# Patient Record
Sex: Female | Born: 1980 | Hispanic: Yes | Marital: Married | Smoking: Never smoker
Health system: Southern US, Community
[De-identification: ages and names within clinical notes are randomized; demographics above are authoritative.]

## PROBLEM LIST (undated history)

## (undated) DIAGNOSIS — J45909 Unspecified asthma, uncomplicated: Secondary | ICD-10-CM

## (undated) DIAGNOSIS — R569 Unspecified convulsions: Secondary | ICD-10-CM

## (undated) HISTORY — PX: SPINAL FUSION: SHX223

## (undated) HISTORY — PX: BACK SURGERY: SHX140

---

## 2019-02-22 NOTE — Progress Notes (Deleted)
[facility]    Provider:  Dr Lucia GaskinsAhern Requesting Provider: Esau GrewLee, Jinoo T, MD Primary Care Provider:   Esau GrewLee, Jinoo T, MD  CC:  ***  HPI:  Lisa Holder is a 38 y.o. female here as requested by Esau GrewLee, Jinoo T, MD for seizure.  Past medical history depression, seizure disorder, lumbosacral pain, chronic, bipolar 2 disorder.  Reviewed notes, labs and imaging from outside physicians, which showed ***  I reviewed notes from Dr. Nedra HaiLee.  She was last seen December 14, 2018.  Minor complaints.  Not using any method of contraception.  Patient does not exercise.  Patient sleeps 5 hours per night.  Libido is decreased.  She performs monthly self breast exam.  She is a history of seizure disorder Trileptal, Keppra on her med list.  Labs were taken including CBC, CMP, lipids, urinalysis, HIV 1 and 2, hemoglobin A1c however I was not provided with those results or any other details of her seizure disorder.  There are no further records in epic or in care everywhere.  I will request labs from her requesting physician Dr. Nedra HaiLee.  I reviewed physical examination which was normal.  Review of Systems: Patient complains of symptoms per HPI as well as the following symptoms ***. Pertinent negatives and positives per HPI. All others negative.   Social History   Socioeconomic History  . Marital status: Not on file    Spouse name: Not on file  . Number of children: Not on file  . Years of education: Not on file  . Highest education level: Not on file  Occupational History  . Not on file  Social Needs  . Financial resource strain: Not on file  . Food insecurity    Worry: Not on file    Inability: Not on file  . Transportation needs    Medical: Not on file    Non-medical: Not on file  Tobacco Use  . Smoking status: Not on file  Substance and Sexual Activity  . Alcohol use: Not on file  . Drug use: Not on file  . Sexual activity: Not on file  Lifestyle  . Physical activity    Days per week:  Not on file    Minutes per session: Not on file  . Stress: Not on file  Relationships  . Social Musicianconnections    Talks on phone: Not on file    Gets together: Not on file    Attends religious service: Not on file    Active member of club or organization: Not on file    Attends meetings of clubs or organizations: Not on file    Relationship status: Not on file  . Intimate partner violence    Fear of current or ex partner: Not on file    Emotionally abused: Not on file    Physically abused: Not on file    Forced sexual activity: Not on file  Other Topics Concern  . Not on file  Social History Narrative  . Not on file    No family history on file.  No past medical history on file.  There are no active problems to display for this patient.   *** The histories are not reviewed yet. Please review them in the "History" navigator section and refresh this SmartLink.  No current outpatient medications on file.   No current facility-administered medications for this visit.     Allergies as of 02/23/2019  . (Not on File)    Vitals: There were no  vitals taken for this visit. Last Weight:  Wt Readings from Last 1 Encounters:  No data found for Wt   Last Height:   Ht Readings from Last 1 Encounters:  No data found for Ht     Physical exam: Exam: Gen: NAD, conversant, well nourised, obese, well groomed                     CV: RRR, no MRG. No Carotid Bruits. No peripheral edema, warm, nontender Eyes: Conjunctivae clear without exudates or hemorrhage  Neuro: Detailed Neurologic Exam  Speech:    Speech is normal; fluent and spontaneous with normal comprehension.  Cognition:    The patient is oriented to person, place, and time;     recent and remote memory intact;     language fluent;     normal attention, concentration,     fund of knowledge Cranial Nerves:    The pupils are equal, round, and reactive to light. The fundi are normal and spontaneous venous pulsations  are present. Visual fields are full to finger confrontation. Extraocular movements are intact. Trigeminal sensation is intact and the muscles of mastication are normal. The face is symmetric. The palate elevates in the midline. Hearing intact. Voice is normal. Shoulder shrug is normal. The tongue has normal motion without fasciculations.   Coordination:    Normal finger to nose and heel to shin. Normal rapid alternating movements.   Gait:    Heel-toe and tandem gait are normal.   Motor Observation:    No asymmetry, no atrophy, and no involuntary movements noted. Tone:    Normal muscle tone.    Posture:    Posture is normal. normal erect    Strength:    Strength is V/V in the upper and lower limbs.      Sensation: intact to LT     Reflex Exam:  DTR's:    Deep tendon reflexes in the upper and lower extremities are normal bilaterally.   Toes:    The toes are downgoing bilaterally.   Clonus:    Clonus is absent.    Assessment/Plan:    No orders of the defined types were placed in this encounter.  No orders of the defined types were placed in this encounter.   Cc: Sharon Seller, MD,  No primary care provider on file.  Sarina Ill, MD  [facility] [address] [place], [place] [address]  Phone [phone] Fax [phone]

## 2019-02-23 ENCOUNTER — Ambulatory Visit: Payer: Medicaid Other | Admitting: Neurology

## 2020-08-08 ENCOUNTER — Emergency Department (HOSPITAL_COMMUNITY)
Admission: EM | Admit: 2020-08-08 | Discharge: 2020-08-08 | Disposition: A | Discharge status: Home / Self Care | Payer: Medicaid Other | Attending: Emergency Medicine | Admitting: Emergency Medicine

## 2020-08-08 ENCOUNTER — Other Ambulatory Visit: Payer: Self-pay

## 2020-08-08 ENCOUNTER — Emergency Department (HOSPITAL_COMMUNITY): Payer: Medicaid Other

## 2020-08-08 ENCOUNTER — Encounter (HOSPITAL_COMMUNITY): Payer: Self-pay

## 2020-08-08 DIAGNOSIS — R1031 Right lower quadrant pain: Secondary | ICD-10-CM | POA: Insufficient documentation

## 2020-08-08 DIAGNOSIS — R319 Hematuria, unspecified: Secondary | ICD-10-CM | POA: Insufficient documentation

## 2020-08-08 DIAGNOSIS — R200 Anesthesia of skin: Secondary | ICD-10-CM | POA: Diagnosis not present

## 2020-08-08 DIAGNOSIS — W19XXXA Unspecified fall, initial encounter: Secondary | ICD-10-CM

## 2020-08-08 DIAGNOSIS — J45909 Unspecified asthma, uncomplicated: Secondary | ICD-10-CM | POA: Diagnosis not present

## 2020-08-08 DIAGNOSIS — Z91010 Allergy to peanuts: Secondary | ICD-10-CM | POA: Diagnosis not present

## 2020-08-08 DIAGNOSIS — S32009A Unspecified fracture of unspecified lumbar vertebra, initial encounter for closed fracture: Secondary | ICD-10-CM | POA: Diagnosis not present

## 2020-08-08 DIAGNOSIS — W001XXA Fall from stairs and steps due to ice and snow, initial encounter: Secondary | ICD-10-CM | POA: Diagnosis not present

## 2020-08-08 DIAGNOSIS — M545 Low back pain, unspecified: Secondary | ICD-10-CM | POA: Diagnosis present

## 2020-08-08 DIAGNOSIS — M549 Dorsalgia, unspecified: Secondary | ICD-10-CM

## 2020-08-08 HISTORY — DX: Unspecified convulsions: R56.9

## 2020-08-08 HISTORY — DX: Unspecified asthma, uncomplicated: J45.909

## 2020-08-08 LAB — URINALYSIS, ROUTINE W REFLEX MICROSCOPIC
Bilirubin Urine: NEGATIVE
Glucose, UA: NEGATIVE mg/dL
Hgb urine dipstick: NEGATIVE
Ketones, ur: NEGATIVE mg/dL
Nitrite: NEGATIVE
Protein, ur: NEGATIVE mg/dL
Specific Gravity, Urine: 1.015 (ref 1.005–1.030)
pH: 5 (ref 5.0–8.0)

## 2020-08-08 LAB — CBC WITH DIFFERENTIAL/PLATELET
Abs Immature Granulocytes: 0.04 10*3/uL (ref 0.00–0.07)
Basophils Absolute: 0.1 10*3/uL (ref 0.0–0.1)
Basophils Relative: 1 %
Eosinophils Absolute: 0.2 10*3/uL (ref 0.0–0.5)
Eosinophils Relative: 2 %
HCT: 41.4 % (ref 36.0–46.0)
Hemoglobin: 14.2 g/dL (ref 12.0–15.0)
Immature Granulocytes: 0 %
Lymphocytes Relative: 39 %
Lymphs Abs: 3.5 10*3/uL (ref 0.7–4.0)
MCH: 32.2 pg (ref 26.0–34.0)
MCHC: 34.3 g/dL (ref 30.0–36.0)
MCV: 93.9 fL (ref 80.0–100.0)
Monocytes Absolute: 0.7 10*3/uL (ref 0.1–1.0)
Monocytes Relative: 8 %
Neutro Abs: 4.6 10*3/uL (ref 1.7–7.7)
Neutrophils Relative %: 50 %
Platelets: 263 10*3/uL (ref 150–400)
RBC: 4.41 MIL/uL (ref 3.87–5.11)
RDW: 12.7 % (ref 11.5–15.5)
WBC: 9.1 10*3/uL (ref 4.0–10.5)
nRBC: 0 % (ref 0.0–0.2)

## 2020-08-08 LAB — COMPREHENSIVE METABOLIC PANEL
ALT: 29 U/L (ref 0–44)
AST: 33 U/L (ref 15–41)
Albumin: 4.2 g/dL (ref 3.5–5.0)
Alkaline Phosphatase: 38 U/L (ref 38–126)
Anion gap: 10 (ref 5–15)
BUN: 16 mg/dL (ref 6–20)
CO2: 23 mmol/L (ref 22–32)
Calcium: 9.2 mg/dL (ref 8.9–10.3)
Chloride: 102 mmol/L (ref 98–111)
Creatinine, Ser: 0.87 mg/dL (ref 0.44–1.00)
GFR, Estimated: >60 mL/min (ref >60)
Glucose, Bld: 107 mg/dL — ABNORMAL HIGH (ref 70–99)
Potassium: 5 mmol/L (ref 3.5–5.1)
Sodium: 135 mmol/L (ref 135–145)
Total Bilirubin: 1.1 mg/dL (ref 0.3–1.2)
Total Protein: 7.8 g/dL (ref 6.5–8.1)

## 2020-08-08 LAB — I-STAT BETA HCG BLOOD, ED (MC, WL, AP ONLY): I-stat hCG, quantitative: <5 m[IU]/mL (ref <5)

## 2020-08-08 MED ORDER — KETOROLAC TROMETHAMINE 15 MG/ML IJ SOLN
15.0000 mg | Freq: Once | INTRAMUSCULAR | Status: AC
  Administered 2020-08-08: 15 mg via INTRAVENOUS
  Filled 2020-08-08: qty 1

## 2020-08-08 MED ORDER — NAPROXEN 500 MG PO TABS: 500.0000 mg | ORAL_TABLET | Freq: Two times a day (BID) | ORAL | 0 refills | Status: DC | PRN

## 2020-08-08 MED ORDER — SODIUM CHLORIDE 0.9 % IV BOLUS
1000.0000 mL | Freq: Once | INTRAVENOUS | Status: AC
  Administered 2020-08-08: 1000 mL via INTRAVENOUS

## 2020-08-08 MED ORDER — MORPHINE SULFATE (PF) 4 MG/ML IV SOLN
4.0000 mg | Freq: Once | INTRAVENOUS | Status: AC
Start: 2020-08-08 — End: 2020-08-08
  Administered 2020-08-08: 4 mg via INTRAVENOUS
  Filled 2020-08-08: qty 1

## 2020-08-08 MED ORDER — IOHEXOL 300 MG/ML  SOLN
100.0000 mL | Freq: Once | INTRAMUSCULAR | Status: AC | PRN
  Administered 2020-08-08: 100 mL via INTRAVENOUS

## 2020-08-08 MED ORDER — LIDOCAINE 5 % EX PTCH
1.0000 | MEDICATED_PATCH | CUTANEOUS | Status: DC
  Administered 2020-08-08: 1 via TRANSDERMAL
  Filled 2020-08-08: qty 1

## 2020-08-08 MED ORDER — ONDANSETRON HCL 4 MG/2ML IJ SOLN
4.0000 mg | Freq: Once | INTRAMUSCULAR | Status: AC
  Administered 2020-08-08: 4 mg via INTRAVENOUS
  Filled 2020-08-08: qty 2

## 2020-08-08 MED ORDER — OXYCODONE-ACETAMINOPHEN 5-325 MG PO TABS: 1.0000 | ORAL_TABLET | Freq: Four times a day (QID) | ORAL | 0 refills | Status: DC | PRN

## 2020-08-08 MED ORDER — LIDOCAINE 5 % EX PTCH: 1.0000 | MEDICATED_PATCH | Freq: Every day | CUTANEOUS | 0 refills | Status: DC | PRN

## 2020-08-08 NOTE — ED Triage Notes (Addendum)
Patient arrived after a fall on Monday causing her lower back pain, denies any LOC. Last dose of Ibuprofen two hours ago. Reports back surgery about 5 years ago. Ambulatory in triage

## 2020-08-08 NOTE — Discharge Instructions (Addendum)
You were seen in the ER today after a fall.  Your CT scan showed findings of transverse processes of your L2 and L3 vertebra.  Please follow-up with your primary care provider or neurosurgery to further discuss these.  These will likely need to heal on their own.  We are sending home with the following medicines to help with pain:  - Naproxen is a nonsteroidal anti-inflammatory medication that will help with pain and swelling. Be sure to take this medication as prescribed with food, 1 pill every 12 hours,  It should be taken with food, as it can cause stomach upset, and more seriously, stomach bleeding. Do not take other nonsteroidal anti-inflammatory medications with this such as Advil, Motrin, Aleve, Mobic, Goodie Powder, or Motrin.    - Lidoderm patch- apply 1 patch to your area of most significant pain once per day. Remove and disregard within 12 hours of application.    -Percocet-this is a narcotic/controlled substance medication that has potential addicting qualities.  We recommend that you take 1-2 tablets every 6 hours as needed for severe pain.  Do not drive or operate heavy machinery when taking this medicine as it can be sedating. Do not drink alcohol or take other sedating medications when taking this medicine for safety reasons.  Keep this out of reach of small children.  Please be aware this medicine has Tylenol in it (325 mg/tab) do not exceed the maximum dose of Tylenol in a day per over the counter recommendations should you decide to supplement with Tylenol over the counter.   We have prescribed you new medication(s) today. Discuss the medications prescribed today with your pharmacist as they can have adverse effects and interactions with your other medicines including over the counter and prescribed medications. Seek medical evaluation if you start to experience new or abnormal symptoms after taking one of these medicines, seek care immediately if you start to experience difficulty  breathing, feeling of your throat closing, facial swelling, or rash as these could be indications of a more serious allergic reaction  Please try to rest, but be sure to still get out of bed and move some like walking to the restroom/minor activity in house.   Please follow up within the next 1 week. Return to the ER for new or worsening symptoms including but not limited to numbness, weakness, loss of bowel/bladder control, fever, blood in your urine or stool, coughing up blood, trouble breathing, passing out, inability to walk, or any other concerns.

## 2020-08-08 NOTE — ED Provider Notes (Signed)
[facility]-EMERGENCY DEPT Provider Note   CSN: [id number] Arrival date & time: 08/08/20  0013     History Chief Complaint  Patient presents with  . Back Pain    Lisa Holder is a 40 y.o. female with a hx of asthma, seizures, and prior spinal fusion who presents to the ED with complaints of back pain S/p fall 3 days prior. Patient states that she slipped and fell on the ice directly onto her lower back and slid down a few steps. She thinks she bumped her head but did not have LOC. No significant immediate pain but has since developed pain to the lower back especially on the right side as well as to the right abdomen some. She noted some blood in her urine yesterday and has had some pain with urination. She is not due for her menstrual cycle. She tried taking ibuprofen without relief. She does have chronic paresthesias down the RLE which seems a bit more prominent, but no new distribution & not completely numb. Denies  weakness, saddle anesthesia, incontinence to bowel/bladder, fever, chills, IV drug use, dysuria, or hx of cancer.   HPI     Past Medical History:  Diagnosis Date  . Asthma   . Seizures (HCC)     There are no problems to display for this patient.   Past Surgical History:  Procedure Laterality Date  . SPINAL FUSION       OB History   No obstetric history on file.     No family history on file.     Home Medications Prior to Admission medications   Not on File    Allergies    Peanut-containing drug products  Review of Systems   Review of Systems  Constitutional: Negative for chills and fever.  Respiratory: Negative for shortness of breath.   Gastrointestinal: Positive for abdominal pain. Negative for vomiting.  Genitourinary: Positive for hematuria.  Musculoskeletal: Positive for back pain.  Neurological: Positive for numbness (RLE paresthesias). Negative for weakness and headaches.       Negative for incontinence or saddle  anesthesia.   All other systems reviewed and are negative.   Physical Exam Updated Vital Signs BP 123/83 (BP Location: Left Arm)   Pulse 99   Temp 99.8 F (37.7 C) (Oral)   Resp 18   SpO2 100%   Physical Exam Vitals and nursing note reviewed.  Constitutional:      General: She is not in acute distress.    Appearance: She is well-developed and well-nourished.  HENT:     Head: Normocephalic and atraumatic. No raccoon eyes or Battle's sign.     Right Ear: No hemotympanum.     Left Ear: No hemotympanum.     Mouth/Throat:     Mouth: Oropharynx is clear and moist.  Eyes:     General:        Right eye: No discharge.        Left eye: No discharge.     Extraocular Movements: EOM normal.     Conjunctiva/sclera: Conjunctivae normal.     Pupils: Pupils are equal, round, and reactive to light.  Cardiovascular:     Rate and Rhythm: Normal rate and regular rhythm.     Pulses:          Posterior tibial pulses are 2+ on the right side and 2+ on the left side.     Heart sounds: No murmur heard.   Pulmonary:     Effort: No respiratory  distress.     Breath sounds: Normal breath sounds. No wheezing or rales.  Chest:    Abdominal:     General: There is no distension.     Palpations: Abdomen is soft.     Tenderness: There is abdominal tenderness in the right lower quadrant. There is no guarding or rebound.  Musculoskeletal:     Cervical back: Normal range of motion and neck supple. No spinous process tenderness.     Comments: Upper/lower extremities: Patient is actively ranging at all major joints of the upper and lower extremities.  There is no focal bony tenderness to the extremities. Back: Patient has tenderness palpation to the lower portion to the entire lumbar spine region including midline and right-sided paraspinal muscles.  Patient has ecchymosis over the right lumbar paraspinal muscle region. Mild tenderness to the very lower portion of the thoracic midline and right paraspinal  muscles. No mid/upper thoracic midline tenderness.   Skin:    General: Skin is warm and dry.     Findings: No rash.  Neurological:     Comments: Alert.  Clear speech.  CN III through XII grossly intact.  Sensation grossly intact bilateral upper and lower extremities.  5 out of 5 symmetric grip strength.  5 out of 5 strength with plantar dorsiflexion bilaterally as well as knee flexion/extension bilaterally.  Psychiatric:        Mood and Affect: Mood and affect normal.        Behavior: Behavior normal.     ED Results / Procedures / Treatments   Labs (all labs ordered are listed, but only abnormal results are displayed) Labs Reviewed  COMPREHENSIVE METABOLIC PANEL - Abnormal; Notable for the following components:      Result Value   Glucose, Bld 107 (*)    All other components within normal limits  CBC WITH DIFFERENTIAL/PLATELET  URINALYSIS, ROUTINE W REFLEX MICROSCOPIC  I-STAT BETA HCG BLOOD, ED ([facility], [facility], [facility] ONLY)    EKG None  Radiology DG Chest 2 View  Result Date: 08/08/2020 CLINICAL DATA:  Fall, right flank pain, rib pain EXAM: CHEST - 2 VIEW COMPARISON:  None. FINDINGS: The heart size and mediastinal contours are within normal limits. Both lungs are clear. The visualized skeletal structures are unremarkable. No visible displaced rib fracture or pneumothorax. IMPRESSION: Negative. Electronically Signed   By: Charlett Nose M.D.   On: 08/08/2020 02:01   CT Abdomen Pelvis W Contrast  Result Date: 08/08/2020 CLINICAL DATA:  Acute pain due trauma EXAM: CT ABDOMEN AND PELVIS WITH CONTRAST CT LUMBAR SPINE WITHOUT CONTRAST TECHNIQUE: Multidetector CT imaging of the abdomen and pelvis was performed using the standard protocol following bolus administration of intravenous contrast. Multiplanar CT images of the lumbar spine were reconstructed from contemporary CT of the Abdomen, and Pelvis CONTRAST:  OMNIPAQUE IOHEXOL 300 MG/ML  SOLN COMPARISON:  None. FINDINGS: Lower chest: The lung  bases are clear. The heart size is normal. Hepatobiliary: The liver is normal. Normal gallbladder.There is no biliary ductal dilation. Pancreas: Normal contours without ductal dilatation. No peripancreatic fluid collection. Spleen: Unremarkable. Adrenals/Urinary Tract: --Adrenal glands: Unremarkable. --Right kidney/ureter: No hydronephrosis or radiopaque kidney stones. --Left kidney/ureter: No hydronephrosis or radiopaque kidney stones. --Urinary bladder: Unremarkable. Stomach/Bowel: --Stomach/Duodenum: No hiatal hernia or other gastric abnormality. Normal duodenal course and caliber. --Small bowel: Unremarkable. --Colon: Unremarkable. --Appendix: Normal. Vascular/Lymphatic: Normal course and caliber of the major abdominal vessels. --No retroperitoneal lymphadenopathy. --No mesenteric lymphadenopathy. --No pelvic or inguinal lymphadenopathy. Reproductive: Unremarkable Other: No ascites  or free air. The abdominal wall is normal. Musculoskeletal. There are acute minimally displaced fractures involving the right L2 and L3 transverse processes. The patient is status post prior L4-L5 posterior fusion with an interbody spacer. There is mild disc height loss at the L5-S1 level. There is no acute compression fracture. IMPRESSION: 1. Acute minimally displaced fractures involving the right L2 and L3 transverse processes. 2. No acute compression fracture involving the lumbar spine. 3. No acute intra-abdominal abnormality. Electronically Signed   By: Katherine Mantle M.D.   On: 08/08/2020 03:11   CT L-SPINE NO CHARGE  Result Date: 08/08/2020 CLINICAL DATA:  Acute pain due trauma EXAM: CT ABDOMEN AND PELVIS WITH CONTRAST CT LUMBAR SPINE WITHOUT CONTRAST TECHNIQUE: Multidetector CT imaging of the abdomen and pelvis was performed using the standard protocol following bolus administration of intravenous contrast. Multiplanar CT images of the lumbar spine were reconstructed from contemporary CT of the Abdomen, and Pelvis  CONTRAST:  OMNIPAQUE IOHEXOL 300 MG/ML  SOLN COMPARISON:  None. FINDINGS: Lower chest: The lung bases are clear. The heart size is normal. Hepatobiliary: The liver is normal. Normal gallbladder.There is no biliary ductal dilation. Pancreas: Normal contours without ductal dilatation. No peripancreatic fluid collection. Spleen: Unremarkable. Adrenals/Urinary Tract: --Adrenal glands: Unremarkable. --Right kidney/ureter: No hydronephrosis or radiopaque kidney stones. --Left kidney/ureter: No hydronephrosis or radiopaque kidney stones. --Urinary bladder: Unremarkable. Stomach/Bowel: --Stomach/Duodenum: No hiatal hernia or other gastric abnormality. Normal duodenal course and caliber. --Small bowel: Unremarkable. --Colon: Unremarkable. --Appendix: Normal. Vascular/Lymphatic: Normal course and caliber of the major abdominal vessels. --No retroperitoneal lymphadenopathy. --No mesenteric lymphadenopathy. --No pelvic or inguinal lymphadenopathy. Reproductive: Unremarkable Other: No ascites or free air. The abdominal wall is normal. Musculoskeletal. There are acute minimally displaced fractures involving the right L2 and L3 transverse processes. The patient is status post prior L4-L5 posterior fusion with an interbody spacer. There is mild disc height loss at the L5-S1 level. There is no acute compression fracture. IMPRESSION: 1. Acute minimally displaced fractures involving the right L2 and L3 transverse processes. 2. No acute compression fracture involving the lumbar spine. 3. No acute intra-abdominal abnormality. Electronically Signed   By: Katherine Mantle M.D.   On: 08/08/2020 03:11    Procedures Procedures   Medications Ordered in ED Medications  lidocaine (LIDODERM) 5 % 1 patch (1 patch Transdermal Patch Applied 08/08/20 0405)  morphine 4 MG/ML injection 4 mg (4 mg Intravenous Given 08/08/20 0134)  ondansetron (ZOFRAN) injection 4 mg (4 mg Intravenous Given 08/08/20 0134)  iohexol (OMNIPAQUE) 300 MG/ML  solution 100 mL (100 mLs Intravenous Contrast Given 08/08/20 0251)  sodium chloride 0.9 % bolus 1,000 mL (0 mLs Intravenous Stopped 08/08/20 0531)  ketorolac (TORADOL) 15 MG/ML injection 15 mg (15 mg Intravenous Given 08/08/20 0404)    ED Course  I have reviewed the triage vital signs and the nursing notes.  Pertinent labs & imaging results that were available during my care of the patient were reviewed by me and considered in my medical decision making (see chart for details).    MDM Rules/Calculators/A&P                         Patient presents to the ED with complaints of back pain S/p fall 3 days prior.   Additional history obtained:  Additional history obtained from nursing note review.   Lab Tests:  I Ordered, reviewed, and interpreted labs, which included:  CBC, CMP, preg test, UA: - fairly unremarkable, trace leukocytes &  rare bacteria on UA- no gross infection.   Imaging Studies ordered:  I ordered imaging studies which included CXR, CT A/P & CT L spine, I independently reviewed, formal radiology impression reveals: Acute minimally displaced fractures involving the right L2 and L3 transverse processes. No other acute injuries.   Patient reported she did mildly bump her head, no LOC, no headache, no neuro deficits, 3 days out- doubt head bleed. No c spine tenderness to palpation. Imaging reveals L2/L3 transverse process fractures, no acute neuro deficits, ambulatory- plan for pain control, no need for TLSO per discussion w/ attending. No other intra-abdominal injuries noted. CXR w/o acute injury- no fx/pneumothorax/hemothorax, given no overlying skin changes, normal vitals, and 3 days from injury doubt significant intra-thoracic trauma. Following analgesics in the ED patient is feeling much better. She remains without acute neuro deficits and is able to ambulate. Will discharge home with pain control & outpatient follow up. I discussed results, treatment plan, need for follow-up, and  return precautions with the patient. Provided opportunity for questions, patient confirmed understanding and is in agreement with plan.   Findings and plan of care discussed with supervising physician Dr. Read Drivers who has provided guidance & is in agreement.   [place] Controlled Substance reporting System queried  Portions of this note were generated with Scientist, clinical (histocompatibility and immunogenetics). Dictation errors may occur despite best attempts at proofreading.  Final Clinical Impression(s) / ED Diagnoses Final diagnoses:  Back pain  Closed fracture of transverse process of lumbar vertebra, initial encounter (HCC)  Fall, initial encounter    Rx / DC Orders ED Discharge Orders         Ordered    oxyCODONE-acetaminophen (PERCOCET/ROXICET) 5-325 MG tablet  Every 6 hours PRN        08/08/20 0530    naproxen (NAPROSYN) 500 MG tablet  2 times daily PRN        08/08/20 0530    lidocaine (LIDODERM) 5 %  Daily PRN        08/08/20 0530           Asa Fath, Pleas Koch, PA-C 08/08/20 0534    Paula Libra, MD 08/08/20 [phone]

## 2020-08-09 LAB — URINE CULTURE: Culture: 30000 — AB

## 2020-08-10 NOTE — Progress Notes (Signed)
ED Antimicrobial Stewardship Positive Culture Follow Up   [name] is an 40 y.o. female who presented to [facility] on 08/08/2020 with a chief complaint of  Chief Complaint  Patient presents with  . Back Pain    Recent Results (from the past 720 hour(s))  Urine culture     Status: Abnormal   Collection Time: 08/08/20  4:30 AM   Specimen: Urine, Clean Catch  Result Value Ref Range Status   Specimen Description   Final    URINE, CLEAN CATCH Performed at [facility], [address], [place], [place] [address]    Special Requests   Final    NONE Performed at [facility], [address] [place], [place] [address]    Culture (A)  Final    30,000 COLONIES/mL GROUP B STREP(S.AGALACTIAE)ISOLATED TESTING AGAINST S. AGALACTIAE NOT ROUTINELY PERFORMED DUE TO PREDICTABILITY OF AMP/PEN/VAN SUSCEPTIBILITY. Performed at [facility], [address], [place], [place] [address]    Report Status 08/09/2020 FINAL  Final    []  Treated with  organism resistant to prescribed antimicrobial [x]  Patient discharged originally without antimicrobial agent and treatment is now possibly indicated  Please do a symptom check with patient, and if symptomatic  New antibiotic prescription: Amoxicillin 500mg  tid x 5 days  ED Provider: , PA   08/10/2020, 10:56 AM Clinical Pharmacist [phone]

## 2020-08-11 ENCOUNTER — Telehealth (HOSPITAL_BASED_OUTPATIENT_CLINIC_OR_DEPARTMENT_OTHER): Payer: Self-pay | Admitting: Emergency Medicine

## 2020-08-11 NOTE — Telephone Encounter (Signed)
Post ED Visit - Positive Culture Follow-up: Unsuccessful Patient Follow-up  Culture assessed and recommendations reviewed by:  []  , Pharm.D. []  Enzo Bi, Pharm.D., BCPS AQ-ID []  , Pharm.D., BCPS []  Celedonio Miyamoto, Pharm.D., BCPS []  [place], Garvin Fila.D., BCPS, AAHIVP []  , Pharm.D., BCPS, AAHIVP [x]  Georgina Pillion, PharmD []  , PharmD, BCPS  Positive urine culture  [x]  Patient discharged without antimicrobial prescription and treatment is now indicated []  Organism is resistant to prescribed ED discharge antimicrobial []  Patient with positive blood cultures   Unable to contact patient @ phone number on file. letter will be sent to address on file  Plan: Call patient for symptom check  If symptomatic, begin Amoxicillin 500 mg PO tid x five days. Britni [place], PA  Lisa Holder 08/11/2020, 3:33 PM

## 2021-01-20 ENCOUNTER — Other Ambulatory Visit: Payer: Self-pay

## 2021-01-20 ENCOUNTER — Encounter (HOSPITAL_COMMUNITY): Payer: Self-pay | Admitting: *Deleted

## 2021-01-20 ENCOUNTER — Emergency Department (HOSPITAL_COMMUNITY)
Admission: EM | Admit: 2021-01-20 | Discharge: 2021-01-20 | Disposition: A | Discharge status: Home / Self Care | Payer: Medicaid Other | Attending: Emergency Medicine | Admitting: Emergency Medicine

## 2021-01-20 DIAGNOSIS — M545 Low back pain, unspecified: Secondary | ICD-10-CM | POA: Diagnosis present

## 2021-01-20 DIAGNOSIS — Z79899 Other long term (current) drug therapy: Secondary | ICD-10-CM | POA: Insufficient documentation

## 2021-01-20 DIAGNOSIS — J45909 Unspecified asthma, uncomplicated: Secondary | ICD-10-CM | POA: Diagnosis not present

## 2021-01-20 DIAGNOSIS — M5442 Lumbago with sciatica, left side: Secondary | ICD-10-CM | POA: Diagnosis not present

## 2021-01-20 DIAGNOSIS — R109 Unspecified abdominal pain: Secondary | ICD-10-CM | POA: Insufficient documentation

## 2021-01-20 DIAGNOSIS — Z91010 Allergy to peanuts: Secondary | ICD-10-CM | POA: Diagnosis not present

## 2021-01-20 MED ORDER — PREDNISONE 20 MG PO TABS: ORAL_TABLET | ORAL | 0 refills | Status: DC

## 2021-01-20 MED ORDER — KETOROLAC TROMETHAMINE 15 MG/ML IJ SOLN
15.0000 mg | Freq: Once | INTRAMUSCULAR | Status: AC
  Administered 2021-01-20: 15 mg via INTRAMUSCULAR
  Filled 2021-01-20: qty 1

## 2021-01-20 MED ORDER — DEXAMETHASONE SODIUM PHOSPHATE 10 MG/ML IJ SOLN
10.0000 mg | Freq: Once | INTRAMUSCULAR | Status: AC
  Administered 2021-01-20: 10 mg via INTRAMUSCULAR
  Filled 2021-01-20: qty 1

## 2021-01-20 MED ORDER — METHOCARBAMOL 500 MG PO TABS: 1000.0000 mg | ORAL_TABLET | Freq: Every evening | ORAL | 0 refills | Status: DC | PRN

## 2021-01-20 NOTE — ED Triage Notes (Signed)
Pt complains of back pain x 2 weeks. She went to PCP last week, was given non-narcotic pain medication. Pain is not improving. Hx of back surgery.

## 2021-01-20 NOTE — Discharge Instructions (Addendum)
Please read and follow all provided instructions.  Your diagnoses today include:  1. Acute left-sided low back pain with left-sided sciatica     Tests performed today include: Vital signs - see below for your results today  Medications prescribed:  Prednisone - steroid medicine   It is best to take this medication in the morning to prevent sleeping problems. If you are diabetic, monitor your blood sugar closely and stop taking Prednisone if blood sugar is over 300. Take with food to prevent stomach upset.   Robaxin (methocarbamol) - muscle relaxer medication  DO NOT drive or perform any activities that require you to be awake and alert because this medicine can make you drowsy.   Take any prescribed medications only as directed.  Home care instructions:  Follow any educational materials contained in this packet Please rest, use ice or heat on your back for the next several days Do not lift, push, pull anything more than 10 pounds for the next week  Follow-up instructions: Please follow-up with your primary care provider in and/or the spine surgeon referral in the next 1 week for further evaluation of your symptoms.   Return instructions:  SEEK IMMEDIATE MEDICAL ATTENTION IF YOU HAVE: New numbness, tingling, weakness, or problem with the use of your arms or legs Severe back pain not relieved with medications Loss control of your bowels or bladder Increasing pain in any areas of the body (such as chest or abdominal pain) Shortness of breath, dizziness, or fainting.  Worsening nausea (feeling sick to your stomach), vomiting, fever, or sweats Any other emergent concerns regarding your health   Additional Information:  Your vital signs today were: BP 128/71 (BP Location: Right Arm)   Pulse 83   Temp 98.6 F (37 C) (Oral)   Resp 14   Ht 5\' 5"  (1.651 m)   Wt 88.9 kg   LMP 01/06/2021   SpO2 99%   BMI 32.62 kg/m  If your blood pressure (BP) was elevated above 135/85 this  visit, please have this repeated by your doctor within one month. --------------

## 2021-01-20 NOTE — ED Provider Notes (Signed)
[facility]-EMERGENCY DEPT Provider Note   CSN: [id number] Arrival date & time: 01/20/21  6160     History Chief Complaint  Patient presents with   Back Pain    Lisa Holder is a 40 y.o. female.  Patient with history of lumbar fusion, fall resulting in 2 lumbar transverse spinous process fractures in February 2022 --presents to the emergency department for evaluation of lower back pain over the past 2 weeks.  She states that the pain is more on the left side and radiates down into her leg.  It is worse with movement and certain position.  She states that the pain also "wraps around" her abdomen and sometimes she gets pain that radiates up into her shoulder and into her arms.  She denies neck pain or headache.  No new trauma.  She states that her pain did improve and resolve after trauma earlier this year.  She saw her doctor who prescribed meloxicam.  She does not currently have a neurosurgeon. Patient denies warning symptoms of back pain including: fecal incontinence, urinary retention or overflow incontinence, night sweats, waking from sleep with back pain, unexplained fevers or weight loss, h/o cancer, IVDU, recent trauma.  No nausea, vomiting, diarrhea, chest pain or cough.      Past Medical History:  Diagnosis Date   Asthma    Seizures (HCC)     There are no problems to display for this patient.   Past Surgical History:  Procedure Laterality Date   SPINAL FUSION       OB History   No obstetric history on file.     No family history on file.     Home Medications Prior to Admission medications   Medication Sig Start Date End Date Taking? Authorizing Provider  methocarbamol (ROBAXIN) 500 MG tablet Take 2 tablets (1,000 mg total) by mouth at bedtime as needed for muscle spasms. 01/20/21  Yes Renne Crigler, PA-C  predniSONE (DELTASONE) 20 MG tablet 3 Tabs PO Days 1-3, then 2 tabs PO Days 4-6, then 1 tab PO Day 7-9, then Half Tab PO Day 10-12  01/20/21  Yes Renne Crigler, PA-C  lidocaine (LIDODERM) 5 % Place 1 patch onto the skin daily as needed. Apply patch to area most significant pain once per day.  Remove and discard patch within 12 hours of application. 08/08/20   Petrucelli, Samantha R, PA-C  naproxen (NAPROSYN) 500 MG tablet Take 1 tablet (500 mg total) by mouth 2 (two) times daily as needed. 08/08/20   Petrucelli, Pleas Koch, PA-C  oxyCODONE-acetaminophen (PERCOCET/ROXICET) 5-325 MG tablet Take 1-2 tablets by mouth every 6 (six) hours as needed for severe pain. 08/08/20   Petrucelli, Samantha R, PA-C    Allergies    Peanut-containing drug products  Review of Systems   Review of Systems  Constitutional:  Negative for fever and unexpected weight change.  Gastrointestinal:  Positive for abdominal pain (Radiating). Negative for constipation.       Negative for fecal incontinence.   Genitourinary:  Negative for dysuria, flank pain, hematuria and pelvic pain.       Negative for urinary incontinence or retention.  Musculoskeletal:  Positive for back pain and myalgias (Radiates into the legs).  Neurological:  Negative for weakness and numbness.       Denies saddle paresthesias.   Physical Exam Updated Vital Signs BP 128/71 (BP Location: Right Arm)   Pulse 83   Temp 98.6 F (37 C) (Oral)   Resp 14   Ht  5\' 5"  (1.651 m)   Wt 88.9 kg   LMP 01/06/2021   SpO2 99%   BMI 32.62 kg/m   Physical Exam Vitals and nursing note reviewed.  Constitutional:      Appearance: She is well-developed.  HENT:     Head: Normocephalic and atraumatic.  Eyes:     Conjunctiva/sclera: Conjunctivae normal.  Pulmonary:     Effort: Pulmonary effort is normal.  Abdominal:     Palpations: Abdomen is soft.     Tenderness: There is no abdominal tenderness.     Comments: Abdomen is soft and nontender.  No rebound or guarding.  Musculoskeletal:        General: Normal range of motion.     Cervical back: Normal range of motion and neck supple. No  tenderness or bony tenderness.     Thoracic back: No tenderness or bony tenderness.     Lumbar back: Tenderness present. No bony tenderness.       Back:     Comments: No step-off noted with palpation of spine.   Skin:    General: Skin is warm and dry.     Findings: No rash.  Neurological:     Mental Status: She is alert.     Sensory: No sensory deficit.     Deep Tendon Reflexes: Reflexes are normal and symmetric.     Comments: 5/5 strength in entire lower extremities bilaterally. No sensation deficit.     ED Results / Procedures / Treatments   Labs (all labs ordered are listed, but only abnormal results are displayed) Labs Reviewed - No data to display  EKG None  Radiology No results found.  Procedures Procedures   Medications Ordered in ED Medications  ketorolac (TORADOL) 15 MG/ML injection 15 mg (has no administration in time range)  dexamethasone (DECADRON) injection 10 mg (has no administration in time range)    ED Course  I have reviewed the triage vital signs and the nursing notes.  Pertinent labs & imaging results that were available during my care of the patient were reviewed by me and considered in my medical decision making (see chart for details).  8:56 AM Patient seen and examined. Medications ordered.  For acute pain we will give IM Toradol and IM Decadron.  Patient does describe a radicular component with pain rating down into her leg.   Vital signs reviewed and are as follows: Vitals:   01/20/21 0831  BP: 128/71  Pulse: 83  Resp: 14  Temp: 98.6 F (37 C)  SpO2: 99%    No red flag s/s of low back pain. Patient was counseled on back pain precautions and told to do activity as tolerated but do not lift, push, or pull heavy objects more than 10 pounds for the next week.  Patient counseled to use ice or heat on back for no longer than 15 minutes every hour.   Patient counseled on proper use of muscle relaxant medication.  They were told not to drink  alcohol, drive any vehicle, or do any dangerous activities while taking this medication.  Patient verbalized understanding.  Patient urged to follow-up with PCP/neurosurg if pain does not improve with treatment and rest or if pain becomes recurrent. Urged to return with worsening severe pain, loss of bowel or bladder control, trouble walking.   The patient verbalizes understanding and agrees with the plan.    MDM Rules/Calculators/A&P  Patient with back pain with some radicular features.  Ongoing over the past 2 weeks.  Transfers spinous fractures in February and previous fusion.  No current neurological deficits. Patient is ambulatory.  Radiation of pain into the leg, around her abdomen, into her shoulder and arm is unusual.  Pattern does not fit with lumbar radiculopathy.  She does however deny neck pain, weakness in the upper extremities, headaches, or other symptoms that would be concerning for central brain or cervical lesion.  No warning symptoms of low back pain including: fecal incontinence, urinary retention or overflow incontinence, night sweats, waking from sleep with back pain, unexplained fevers or weight loss, h/o cancer, IVDU, recent trauma. No concern for cauda equina, epidural abscess, or other serious cause of back pain. Conservative measures such as rest, ice/heat and pain medicine indicated with PCP follow-up if no improvement with conservative management.   Final Clinical Impression(s) / ED Diagnoses Final diagnoses:  Acute left-sided low back pain with left-sided sciatica    Rx / DC Orders ED Discharge Orders          Ordered    predniSONE (DELTASONE) 20 MG tablet        01/20/21 0850    methocarbamol (ROBAXIN) 500 MG tablet  At bedtime PRN        01/20/21 0850             Renne Crigler, PA-C 01/20/21 0858    Koleen Distance, MD 01/20/21 [phone]

## 2021-01-31 ENCOUNTER — Other Ambulatory Visit: Payer: Self-pay | Admitting: Neurosurgery

## 2021-01-31 DIAGNOSIS — M5416 Radiculopathy, lumbar region: Secondary | ICD-10-CM

## 2021-03-30 ENCOUNTER — Ambulatory Visit
Admission: RE | Admit: 2021-03-30 | Discharge: 2021-03-30 | Disposition: A | Discharge status: Home / Self Care | Payer: Medicaid Other | Source: Ambulatory Visit | Attending: Neurosurgery | Admitting: Neurosurgery

## 2021-03-30 ENCOUNTER — Other Ambulatory Visit: Payer: Self-pay

## 2021-03-30 DIAGNOSIS — M5416 Radiculopathy, lumbar region: Secondary | ICD-10-CM

## 2021-06-13 ENCOUNTER — Emergency Department (HOSPITAL_COMMUNITY)
Admission: EM | Admit: 2021-06-13 | Discharge: 2021-06-14 | Disposition: A | Discharge status: Home / Self Care | Payer: Medicaid Other | Attending: Emergency Medicine | Admitting: Emergency Medicine

## 2021-06-13 ENCOUNTER — Other Ambulatory Visit: Payer: Self-pay

## 2021-06-13 ENCOUNTER — Encounter (HOSPITAL_COMMUNITY): Payer: Self-pay | Admitting: Emergency Medicine

## 2021-06-13 ENCOUNTER — Emergency Department (HOSPITAL_COMMUNITY): Payer: Medicaid Other

## 2021-06-13 DIAGNOSIS — R059 Cough, unspecified: Secondary | ICD-10-CM | POA: Diagnosis present

## 2021-06-13 DIAGNOSIS — J45909 Unspecified asthma, uncomplicated: Secondary | ICD-10-CM | POA: Insufficient documentation

## 2021-06-13 DIAGNOSIS — R1110 Vomiting, unspecified: Secondary | ICD-10-CM | POA: Diagnosis not present

## 2021-06-13 DIAGNOSIS — Z20822 Contact with and (suspected) exposure to covid-19: Secondary | ICD-10-CM | POA: Insufficient documentation

## 2021-06-13 DIAGNOSIS — Z91010 Allergy to peanuts: Secondary | ICD-10-CM | POA: Diagnosis not present

## 2021-06-13 DIAGNOSIS — J069 Acute upper respiratory infection, unspecified: Secondary | ICD-10-CM | POA: Insufficient documentation

## 2021-06-13 NOTE — ED Triage Notes (Signed)
Patient arrives complaining of flu symptoms x3 days. Patient endorses N/V, fever, productive cough with green sputum, and fatigue, hx of asthma. 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

## 2021-06-14 LAB — RESP PANEL BY RT-PCR (FLU A&B, COVID) ARPGX2
Influenza A by PCR: NEGATIVE
Influenza B by PCR: NEGATIVE
SARS Coronavirus 2 by RT PCR: NEGATIVE

## 2021-06-14 MED ORDER — IPRATROPIUM-ALBUTEROL 0.5-2.5 (3) MG/3ML IN SOLN
3.0000 mL | Freq: Once | RESPIRATORY_TRACT | Status: AC
  Administered 2021-06-14: 3 mL via RESPIRATORY_TRACT
  Filled 2021-06-14: qty 3

## 2021-06-14 MED ORDER — ALBUTEROL SULFATE (5 MG/ML) 0.5% IN NEBU: 2.5000 mg | INHALATION_SOLUTION | Freq: Four times a day (QID) | RESPIRATORY_TRACT | 0 refills | Status: AC | PRN

## 2021-06-14 MED ORDER — BENZONATATE 100 MG PO CAPS
200.0000 mg | ORAL_CAPSULE | Freq: Once | ORAL | Status: AC
  Administered 2021-06-14: 200 mg via ORAL
  Filled 2021-06-14: qty 2

## 2021-06-14 MED ORDER — BENZONATATE 100 MG PO CAPS: 100.0000 mg | ORAL_CAPSULE | Freq: Three times a day (TID) | ORAL | 0 refills | Status: DC | PRN

## 2021-06-14 MED ORDER — ALBUTEROL SULFATE HFA 108 (90 BASE) MCG/ACT IN AERS: 2.0000 | INHALATION_SPRAY | RESPIRATORY_TRACT | 1 refills | Status: DC | PRN

## 2021-06-14 MED ORDER — DEXAMETHASONE 4 MG PO TABS
10.0000 mg | ORAL_TABLET | Freq: Once | ORAL | Status: AC
  Administered 2021-06-14: 10 mg via ORAL
  Filled 2021-06-14: qty 1

## 2021-06-14 NOTE — Discharge Instructions (Addendum)
You can take mucinex, available over the counter according to label instructions for cough.

## 2021-06-14 NOTE — ED Provider Notes (Signed)
[facility]-EMERGENCY DEPT Provider Note   CSN: [id number] Arrival date & time: 06/13/21  2216     History Chief Complaint  Patient presents with   Cough   Fever    Lisa Holder is a 40 y.o. female.  The history is provided by the patient.  Cough Associated symptoms: fever   Fever Associated symptoms: cough   Lisa Holder is a 40 y.o. female who presents to the Emergency Department complaining of cough.  She presents to the ED for evaluation of cough that started on Monday.  Now has pain with cough since Wednesday.  Cough is productive of green sputum.  Just started running a fever today to 100.  Has associated HA that started today.  Had one episode of emesis today.  No lower extremity edema.  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   Has a hx/o asthma.  Uses an albuterol inhaler, which seems to help with sxs.  Has a hx/o seizure d/o, takes keppra.      Past Medical History:  Diagnosis Date   Asthma    Seizures (HCC)     There are no problems to display for this patient.   Past Surgical History:  Procedure Laterality Date   SPINAL FUSION       OB History   No obstetric history on file.     No family history on file.  Social History   Tobacco Use   Smoking status: Never   Smokeless tobacco: Never  Substance Use Topics   Alcohol use: Not Currently   Drug use: Not Currently    Home Medications Prior to Admission medications   Medication Sig Start Date End Date Taking? Authorizing Provider  albuterol (PROVENTIL) (5 MG/ML) 0.5% nebulizer solution Take 0.5 mLs (2.5 mg total) by nebulization every 6 (six) hours as needed for wheezing or shortness of breath. 06/14/21  Yes Tilden Fossa, MD  albuterol (VENTOLIN HFA) 108 (90 Base) MCG/ACT inhaler Inhale 2 puffs into the lungs every 4 (four) hours as needed for wheezing or shortness of breath. 06/14/21  Yes Tilden Fossa, MD  benzonatate (TESSALON) 100 MG capsule Take 1 capsule (100  mg total) by mouth 3 (three) times daily as needed for cough. 06/14/21  Yes Tilden Fossa, MD  lidocaine (LIDODERM) 5 % Place 1 patch onto the skin daily as needed. Apply patch to area most significant pain once per day.  Remove and discard patch within 12 hours of application. 08/08/20   Petrucelli, Samantha R, PA-C  methocarbamol (ROBAXIN) 500 MG tablet Take 2 tablets (1,000 mg total) by mouth at bedtime as needed for muscle spasms. 01/20/21   Renne Crigler, PA-C  naproxen (NAPROSYN) 500 MG tablet Take 1 tablet (500 mg total) by mouth 2 (two) times daily as needed. 08/08/20   Petrucelli, Pleas Koch, PA-C  oxyCODONE-acetaminophen (PERCOCET/ROXICET) 5-325 MG tablet Take 1-2 tablets by mouth every 6 (six) hours as needed for severe pain. 08/08/20   Petrucelli, Samantha R, PA-C  predniSONE (DELTASONE) 20 MG tablet 3 Tabs PO Days 1-3, then 2 tabs PO Days 4-6, then 1 tab PO Day 7-9, then Half Tab PO Day 10-12 01/20/21   Renne Crigler, PA-C    Allergies    Peanut-containing drug products  Review of Systems   Review of Systems  Constitutional:  Positive for fever.  Respiratory:  Positive for cough.   All other systems reviewed and are negative.  Physical Exam Updated Vital Signs BP 114/73 (BP Location: Left  Arm)    Pulse 70    Temp 98.3 F (36.8 C) (Oral)    Resp 15    Ht 5\' 5"  (1.651 m)    Wt 90.7 kg    SpO2 99%    BMI 33.28 kg/m   Physical Exam Vitals and nursing note reviewed.  Constitutional:      Appearance: She is well-developed.  HENT:     Head: Normocephalic and atraumatic.  Cardiovascular:     Rate and Rhythm: Normal rate and regular rhythm.     Heart sounds: No murmur heard. Pulmonary:     Effort: Pulmonary effort is normal. No respiratory distress.     Comments: Frequent coughing, decreased air movement bilaterally Abdominal:     Palpations: Abdomen is soft.     Tenderness: There is no abdominal tenderness. There is no guarding or rebound.  Musculoskeletal:        General:  No swelling or tenderness.  Skin:    General: Skin is warm and dry.  Neurological:     Mental Status: She is alert and oriented to person, place, and time.  Psychiatric:        Behavior: Behavior normal.    ED Results / Procedures / Treatments   Labs (all labs ordered are listed, but only abnormal results are displayed) Labs Reviewed  RESP PANEL BY RT-PCR (FLU A&B, COVID) [id number]    EKG None  Radiology DG Chest 2 View  Result Date: 06/13/2021 CLINICAL DATA:  Shortness of breath EXAM: CHEST - 2 VIEW COMPARISON:  08/08/2020 FINDINGS: The heart size and mediastinal contours are within normal limits. Both lungs are clear. The visualized skeletal structures are unremarkable. IMPRESSION: No active cardiopulmonary disease. Electronically Signed   By: 10/06/2020 M.D.   On: 06/13/2021 23:08    Procedures Procedures   Medications Ordered in ED Medications  dexamethasone (DECADRON) tablet 10 mg (10 mg Oral Given 06/14/21 0543)  benzonatate (TESSALON) capsule 200 mg (200 mg Oral Given 06/14/21 0543)  ipratropium-albuterol (DUONEB) 0.5-2.5 (3) MG/3ML nebulizer solution 3 mL (3 mLs Nebulization Given 06/14/21 0542)    ED Course  I have reviewed the triage vital signs and the nursing notes.  Pertinent labs & imaging results that were available during my care of the patient were reviewed by me and considered in my medical decision making (see chart for details).    MDM Rules/Calculators/A&P                         Patient with history of asthma here for evaluation of cough for the last several days.  She has frequent coughing on evaluation with no respiratory distress.  Initial exam with mildly decreased lung sounds bilaterally.  She was treated with albuterol with partial improvement in her symptoms.  On repeat lung exam she has good air movement bilaterally with no significant wheezing.  Chest x-ray is without acute abnormality.  Presentation is not consistent with PE, pneumonia, CHF,  status asthmaticus.  Discussed with patient home care for viral URI with cough with symptomatic treatment at home as well as outpatient follow-up and return precautions.    Final Clinical Impression(s) / ED Diagnoses Final diagnoses:  Viral upper respiratory tract infection    Rx / DC Orders ED Discharge Orders          Ordered    benzonatate (TESSALON) 100 MG capsule  3 times daily PRN        06/14/21 0612  albuterol (VENTOLIN HFA) 108 (90 Base) MCG/ACT inhaler  Every 4 hours PRN        06/14/21 0612    albuterol (PROVENTIL) (5 MG/ML) 0.5% nebulizer solution  Every 6 hours PRN        06/14/21 0932             Tilden Fossa, MD 06/14/21 [phone]

## 2021-08-18 ENCOUNTER — Other Ambulatory Visit: Payer: Self-pay

## 2021-08-18 ENCOUNTER — Encounter (HOSPITAL_COMMUNITY): Payer: Self-pay

## 2021-08-18 ENCOUNTER — Emergency Department (HOSPITAL_COMMUNITY)
Admission: EM | Admit: 2021-08-18 | Discharge: 2021-08-18 | Disposition: A | Discharge status: Home / Self Care | Payer: Medicaid Other | Attending: Emergency Medicine | Admitting: Emergency Medicine

## 2021-08-18 DIAGNOSIS — Z7951 Long term (current) use of inhaled steroids: Secondary | ICD-10-CM | POA: Insufficient documentation

## 2021-08-18 DIAGNOSIS — Z20822 Contact with and (suspected) exposure to covid-19: Secondary | ICD-10-CM | POA: Diagnosis not present

## 2021-08-18 DIAGNOSIS — J45909 Unspecified asthma, uncomplicated: Secondary | ICD-10-CM | POA: Diagnosis not present

## 2021-08-18 DIAGNOSIS — R5383 Other fatigue: Secondary | ICD-10-CM | POA: Insufficient documentation

## 2021-08-18 DIAGNOSIS — R509 Fever, unspecified: Secondary | ICD-10-CM | POA: Diagnosis present

## 2021-08-18 DIAGNOSIS — R590 Localized enlarged lymph nodes: Secondary | ICD-10-CM | POA: Insufficient documentation

## 2021-08-18 DIAGNOSIS — Z91010 Allergy to peanuts: Secondary | ICD-10-CM | POA: Diagnosis not present

## 2021-08-18 DIAGNOSIS — J029 Acute pharyngitis, unspecified: Secondary | ICD-10-CM | POA: Diagnosis not present

## 2021-08-18 LAB — RESP PANEL BY RT-PCR (FLU A&B, COVID) ARPGX2
Influenza A by PCR: NEGATIVE
Influenza B by PCR: NEGATIVE
SARS Coronavirus 2 by RT PCR: NEGATIVE

## 2021-08-18 LAB — GROUP A STREP BY PCR: Group A Strep by PCR: DETECTED — AB

## 2021-08-18 MED ORDER — PREDNISONE 10 MG (21) PO TBPK: ORAL_TABLET | Freq: Every day | ORAL | 0 refills | Status: DC

## 2021-08-18 MED ORDER — CEPHALEXIN 500 MG PO CAPS: 500.0000 mg | ORAL_CAPSULE | Freq: Two times a day (BID) | ORAL | 0 refills | Status: AC

## 2021-08-18 MED ORDER — IPRATROPIUM-ALBUTEROL 0.5-2.5 (3) MG/3ML IN SOLN
3.0000 mL | Freq: Once | RESPIRATORY_TRACT | Status: AC
  Administered 2021-08-18: 3 mL via RESPIRATORY_TRACT
  Filled 2021-08-18: qty 3

## 2021-08-18 MED ORDER — ALBUTEROL SULFATE HFA 108 (90 BASE) MCG/ACT IN AERS: 1.0000 | INHALATION_SPRAY | Freq: Four times a day (QID) | RESPIRATORY_TRACT | 0 refills | Status: AC | PRN

## 2021-08-18 MED ORDER — IBUPROFEN 800 MG PO TABS
800.0000 mg | ORAL_TABLET | Freq: Once | ORAL | Status: AC
  Administered 2021-08-18: 800 mg via ORAL
  Filled 2021-08-18: qty 1

## 2021-08-18 MED ORDER — CEPHALEXIN 500 MG PO CAPS
500.0000 mg | ORAL_CAPSULE | Freq: Once | ORAL | Status: AC
  Administered 2021-08-18: 500 mg via ORAL
  Filled 2021-08-18: qty 1

## 2021-08-18 MED ORDER — ONDANSETRON 4 MG PO TBDP: 4.0000 mg | ORAL_TABLET | Freq: Three times a day (TID) | ORAL | 0 refills | Status: DC | PRN

## 2021-08-18 MED ORDER — ONDANSETRON 4 MG PO TBDP
4.0000 mg | ORAL_TABLET | Freq: Once | ORAL | Status: AC
  Administered 2021-08-18: 4 mg via ORAL
  Filled 2021-08-18: qty 1

## 2021-08-18 NOTE — Discharge Instructions (Signed)
You are seen here today for your sore throat and body aches.  You were found of strep throat and the prescribed antibiotic to treat this.  Additionally you likely have a viral illness that is causing an exacerbation of your asthma.  You been prescribed as needed nausea medication as well as been given a refill of your albuterol inhaler.  You have also been prescribed a steroid to take for the next week to try to prevent further asthma exacerbation.  Follow-up with your primary care doctor return to ER with any new severe symptoms.

## 2021-08-18 NOTE — ED Triage Notes (Signed)
Pt reports with generalized body aches, fever, and sore throat since yesterday.

## 2021-08-18 NOTE — ED Provider Notes (Signed)
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  She also has history of asthma and states that she has been using her albuterol inhaler multiple times per hour due to chest tightness with some improvement. She denies any diarrhea but does endorse nausea and a few episodes of NBNB emesis in the last 24 hours.  In addition to history of asthma she has history of seizures but is not on any medications daily.    HPI     Home Medications Prior to Admission medications   Medication Sig Start Date End Date Taking? Authorizing Provider  albuterol (PROVENTIL) (5 MG/ML) 0.5% nebulizer solution Take 0.5 mLs (2.5 mg total) by nebulization every 6 (six) hours as needed for wheezing or shortness of breath. 06/14/21   Tilden Fossa, MD  albuterol (VENTOLIN HFA) 108 (90 Base) MCG/ACT inhaler Inhale 2 puffs into the lungs every 4 (four) hours as needed for wheezing or shortness of breath. 06/14/21   Tilden Fossa, MD  benzonatate (TESSALON) 100 MG capsule Take 1 capsule (100 mg total) by mouth 3 (three) times daily as needed for cough. 06/14/21   Tilden Fossa, MD  lidocaine (LIDODERM) 5 % Place 1 patch onto the skin daily as needed. Apply patch to area most significant pain once per day.  Remove and discard patch within 12 hours of application. 08/08/20   Petrucelli, Samantha R, PA-C  methocarbamol (ROBAXIN) 500 MG tablet Take 2 tablets (1,000 mg total) by mouth at bedtime as needed for muscle spasms. 01/20/21   Renne Crigler, PA-C  naproxen (NAPROSYN) 500 MG tablet Take 1 tablet (500 mg total) by mouth 2 (two) times daily as needed. 08/08/20   Petrucelli,  Pleas Koch, PA-C  oxyCODONE-acetaminophen (PERCOCET/ROXICET) 5-325 MG tablet Take 1-2 tablets by mouth every 6 (six) hours as needed for severe pain. 08/08/20   Petrucelli, Samantha R, PA-C  predniSONE (DELTASONE) 20 MG tablet 3 Tabs PO Days 1-3, then 2 tabs PO Days 4-6, then 1 tab PO Day 7-9, then Half Tab PO Day 10-12 01/20/21   Renne Crigler, PA-C      Allergies    Peanut-containing drug products    Review of Systems   Review of Systems  Constitutional:  Positive for activity change, appetite change, chills, fatigue and fever.  HENT:  Positive for congestion and rhinorrhea. Negative for trouble swallowing.   Eyes: Negative.   Respiratory:  Positive for cough, chest tightness and wheezing. Negative for stridor.   Cardiovascular: Negative.   Gastrointestinal:  Positive for nausea and vomiting. Negative for abdominal pain, blood in stool, constipation and diarrhea.  Genitourinary: Negative.   Musculoskeletal:  Positive for myalgias.  Skin: Negative.   Neurological:  Positive for headaches.   Physical Exam Updated Vital Signs BP 123/85 (BP Location: Left Arm)    Pulse 82    Temp 98 F (36.7 C) (Oral)    Resp 16    Ht 5\' 5"  (1.651 m)    Wt 81.6 kg    SpO2 100%    BMI 29.95 kg/m  Physical Exam Vitals and nursing note reviewed.  Constitutional:  Appearance: She is not ill-appearing or toxic-appearing.  HENT:     Head: Normocephalic and atraumatic.     Right Ear: Tympanic membrane normal.     Left Ear: Tympanic membrane normal.     Nose: Congestion and rhinorrhea present. Rhinorrhea is clear.     Mouth/Throat:     Mouth: Mucous membranes are moist.     Pharynx: Uvula midline. Oropharyngeal exudate and posterior oropharyngeal erythema present.     Tonsils: Tonsillar exudate present. No tonsillar abscesses. 2+ on the right. 2+ on the left.     Comments: Edematous beefy red tonsils bilaterally with exudate and posterior pharyngeal erythema and exudate without sign of oropharyngeal  abscess. Eyes:     General: Lids are normal. Vision grossly intact.        Right eye: No discharge.        Left eye: No discharge.     Extraocular Movements: Extraocular movements intact.     Conjunctiva/sclera: Conjunctivae normal.     Pupils: Pupils are equal, round, and reactive to light.  Neck:     Trachea: Trachea and phonation normal.  Cardiovascular:     Rate and Rhythm: Normal rate and regular rhythm.     Pulses: Normal pulses.     Heart sounds: Normal heart sounds. No murmur heard. Pulmonary:     Effort: Pulmonary effort is normal. No tachypnea, bradypnea, accessory muscle usage, prolonged expiration or respiratory distress.     Breath sounds: Normal breath sounds. Decreased air movement present. No wheezing or rales.  Chest:     Chest wall: No mass, lacerations, deformity, swelling, tenderness, crepitus or edema.  Abdominal:     General: Bowel sounds are normal. There is no distension.     Palpations: Abdomen is soft.     Tenderness: There is no abdominal tenderness. There is no right CVA tenderness, left CVA tenderness, guarding or rebound.  Musculoskeletal:        General: No deformity.     Cervical back: Normal range of motion and neck supple.  Lymphadenopathy:     Cervical: Cervical adenopathy present.     Right cervical: Superficial cervical adenopathy present.     Left cervical: Superficial cervical adenopathy present.  Skin:    General: Skin is warm and dry.     Capillary Refill: Capillary refill takes less than 2 seconds.  Neurological:     Mental Status: She is alert. Mental status is at baseline.  Psychiatric:        Mood and Affect: Mood normal.    ED Results / Procedures / Treatments   Labs (all labs ordered are listed, but only abnormal results are displayed) Labs Reviewed  GROUP A STREP BY PCR - Abnormal; Notable for the following components:      Result Value   Group A Strep by PCR DETECTED (*)    All other components within normal limits  RESP  PANEL BY RT-PCR (FLU A&B, COVID) ARPGX2    EKG None  Radiology No results found.  Procedures Procedures    Medications Ordered in ED Medications - No data to display  ED Course/ Medical Decision Making/ A&P                           Medical Decision Making 41 year old female presents with URI symptoms and sore throat for the last 48 hours.  Vital signs are normal and intake.  Cardiopulmonary and abdominal exams are benign.  Patient with  beefy red edematous tonsils with exudate, shotty lymphadenopathy in the neck, and decreased air movement throughout the lung fields bilaterally.  Amount and/or Complexity of Data Reviewed Labs: ordered.    Details: Strep test is positive.  RVP is negative.  Risk Prescription drug management.   Patient reevaluated after ministration of ODT Zofran, ibuprofen, and DuoNeb with improvement in all of her symptoms.  HPI and physical exam most consistent with acute viral etiology of her symptoms; additionally does have group A strep positive pharyngitis today.  Will discharge with antibiotics, as needed antiemetic, and refill of her albuterol inhaler.  Additionally given significant improvement in her chest tightness after DuoNeb will discharge with prednisone taper. Recommend close follow-up with her PCP.  Clinical concern for underlying emergent etiology that would warrant further ED management or inpatient management is low.  Arthur voiced understanding of her medical evaluation and treatment plan.  Each of her questions was answered to her expressed satisfaction.  Return precautions were given.  Patient is well-appearing, stable, and was discharged in good condition.  This chart was dictated using voice recognition software, Dragon. Despite the best efforts of this provider to proofread and correct errors, errors may still occur which can change documentation meaning.  Final Clinical Impression(s) / ED Diagnoses Final diagnoses:  None    Rx / DC  Orders ED Discharge Orders     None         Sherrilee Gilles 08/19/21 0008    Gloris Manchester, MD 08/19/21 [phone]

## 2021-11-17 ENCOUNTER — Emergency Department (HOSPITAL_COMMUNITY)
Admission: EM | Admit: 2021-11-17 | Discharge: 2021-11-17 | Disposition: A | Discharge status: Home / Self Care | Payer: Medicaid Other | Attending: Emergency Medicine | Admitting: Emergency Medicine

## 2021-11-17 ENCOUNTER — Encounter (HOSPITAL_COMMUNITY): Payer: Self-pay

## 2021-11-17 DIAGNOSIS — J029 Acute pharyngitis, unspecified: Secondary | ICD-10-CM | POA: Insufficient documentation

## 2021-11-17 DIAGNOSIS — Z20822 Contact with and (suspected) exposure to covid-19: Secondary | ICD-10-CM | POA: Insufficient documentation

## 2021-11-17 DIAGNOSIS — R21 Rash and other nonspecific skin eruption: Secondary | ICD-10-CM | POA: Insufficient documentation

## 2021-11-17 DIAGNOSIS — Z91010 Allergy to peanuts: Secondary | ICD-10-CM | POA: Diagnosis not present

## 2021-11-17 DIAGNOSIS — R059 Cough, unspecified: Secondary | ICD-10-CM | POA: Insufficient documentation

## 2021-11-17 DIAGNOSIS — R509 Fever, unspecified: Secondary | ICD-10-CM | POA: Diagnosis not present

## 2021-11-17 LAB — RESP PANEL BY RT-PCR (FLU A&B, COVID) ARPGX2
Influenza A by PCR: NEGATIVE
Influenza B by PCR: NEGATIVE
SARS Coronavirus 2 by RT PCR: NEGATIVE

## 2021-11-17 LAB — GROUP A STREP BY PCR: Group A Strep by PCR: NOT DETECTED

## 2021-11-17 MED ORDER — PENICILLIN G BENZATHINE 1200000 UNIT/2ML IM SUSY
1.2000 10*6.[IU] | PREFILLED_SYRINGE | Freq: Once | INTRAMUSCULAR | Status: AC
  Administered 2021-11-17: 1.2 10*6.[IU] via INTRAMUSCULAR
  Filled 2021-11-17: qty 2

## 2021-11-17 MED ORDER — LIDOCAINE VISCOUS HCL 2 % MT SOLN: 5.0000 mL | Freq: Four times a day (QID) | OROMUCOSAL | 0 refills | Status: DC | PRN

## 2021-11-17 MED ORDER — DEXAMETHASONE SODIUM PHOSPHATE 10 MG/ML IJ SOLN
10.0000 mg | Freq: Once | INTRAMUSCULAR | Status: AC
  Administered 2021-11-17: 10 mg via INTRAMUSCULAR
  Filled 2021-11-17: qty 1

## 2021-11-17 MED ORDER — LIDOCAINE VISCOUS HCL 2 % MT SOLN
15.0000 mL | Freq: Once | OROMUCOSAL | Status: AC
  Administered 2021-11-17: 15 mL via ORAL
  Filled 2021-11-17: qty 15

## 2021-11-17 MED ORDER — ALUM & MAG HYDROXIDE-SIMETH 200-200-20 MG/5ML PO SUSP
30.0000 mL | Freq: Once | ORAL | Status: AC
  Administered 2021-11-17: 30 mL via ORAL
  Filled 2021-11-17: qty 30

## 2021-11-17 MED ORDER — KETOROLAC TROMETHAMINE 30 MG/ML IJ SOLN
30.0000 mg | Freq: Once | INTRAMUSCULAR | Status: AC
  Administered 2021-11-17: 30 mg via INTRAMUSCULAR
  Filled 2021-11-17: qty 1

## 2021-11-17 NOTE — ED Triage Notes (Signed)
Pt arrived via POV, c/o sore throat and rash.

## 2021-11-17 NOTE — ED Provider Notes (Signed)
[facility] DEPT Provider Note   CSN: [id number] Arrival date & time: 11/17/21  1807     History {Add pertinent medical, surgical, social history, OB history to HPI:1} Chief Complaint  Patient presents with   Sore Throat         Lisa Holder is a 41 y.o. female with chief complaint of sore throat and bilateral arm rash which started less than 2 days ago.  Has been around 3-5 people with similar symptoms who have tested positive for strep.  Endorses very mild, intermittent/sparse and nonproductive cough.  Endorses fever of 101 yesterday and painful swallowing.  Is able to swallow food and liquids, but states it is uncomfortable.  Has been taking ibuprofen to help, which has brought her fever down.  Denies chest pain, shortness of breath, congestion, postnasal drip, or headache.  Denies seasonal or medication allergies.  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  No significant history of rash, this rash started at the same time, is comprised of many small flesh-colored bumps that are mildly itchy.  These are not present anywhere else on her body.  Denies recently being outside or starting any new skin products.  The history is provided by the patient and medical records.  Sore Throat      Home Medications Prior to Admission medications   Medication Sig Start Date End Date Taking? Authorizing Provider  magic mouthwash (lidocaine, diphenhydrAMINE, alum & mag hydroxide) suspension Swish and swallow 5 mLs 4 (four) times daily as needed for up to 10 doses for mouth pain. 11/17/21  Yes Prince Rome, PA-C  albuterol (PROVENTIL) (5 MG/ML) 0.5% nebulizer solution Take 0.5 mLs (2.5 mg total) by nebulization every 6 (six) hours as needed for wheezing or shortness of breath. 06/14/21   Quintella Reichert, MD  albuterol (VENTOLIN HFA) 108 (90 Base) MCG/ACT inhaler Inhale 1-2 puffs into the lungs every 6 (six) hours as needed for wheezing or shortness of breath. 08/18/21   Sponseller,  Eugene Garnet R, PA-C  benzonatate (TESSALON) 100 MG capsule Take 1 capsule (100 mg total) by mouth 3 (three) times daily as needed for cough. 06/14/21   Quintella Reichert, MD  lidocaine (LIDODERM) 5 % Place 1 patch onto the skin daily as needed. Apply patch to area most significant pain once per day.  Remove and discard patch within 12 hours of application. 08/08/20   Petrucelli, Samantha R, PA-C  methocarbamol (ROBAXIN) 500 MG tablet Take 2 tablets (1,000 mg total) by mouth at bedtime as needed for muscle spasms. 01/20/21   Carlisle Cater, PA-C  naproxen (NAPROSYN) 500 MG tablet Take 1 tablet (500 mg total) by mouth 2 (two) times daily as needed. 08/08/20   Petrucelli, Samantha R, PA-C  ondansetron (ZOFRAN-ODT) 4 MG disintegrating tablet Take 1 tablet (4 mg total) by mouth every 8 (eight) hours as needed for nausea or vomiting. 08/18/21   Sponseller, Eugene Garnet R, PA-C  oxyCODONE-acetaminophen (PERCOCET/ROXICET) 5-325 MG tablet Take 1-2 tablets by mouth every 6 (six) hours as needed for severe pain. 08/08/20   Petrucelli, Samantha R, PA-C  predniSONE (STERAPRED UNI-PAK 21 TAB) 10 MG (21) TBPK tablet Take by mouth daily. Take 6 tabs by mouth daily  for 1 days, then 5 tabs for 1 days, then 4 tabs for 1 days, then 3 tabs for 1 days, 2 tabs for 1 days, then 1 tab by mouth daily for 1 days 08/18/21   Sponseller, Rebekah R, PA-C      Allergies    Peanut-containing drug  products    Review of Systems   Review of Systems  Constitutional:  Positive for fever.  HENT:  Positive for sore throat and trouble swallowing (Painful).   Skin:  Positive for rash (Arms).  Allergic/Immunologic: Positive for food allergies.   Physical Exam Updated Vital Signs BP 120/84 (BP Location: Right Arm)   Pulse 83   Temp 98.8 F (37.1 C) (Oral)   Resp 18   Ht 5\' 5"  (1.651 m)   Wt 83.9 kg   SpO2 99%   BMI 30.79 kg/m  Physical Exam Vitals and nursing note reviewed.  Constitutional:      General: She is not in acute distress.     Appearance: Normal appearance. She is well-developed. She is not ill-appearing or diaphoretic.  HENT:     Head: Normocephalic and atraumatic.     Right Ear: External ear normal. No drainage or tenderness.     Left Ear: External ear normal. No drainage or tenderness.     Nose: No congestion.     Mouth/Throat:     Mouth: Mucous membranes are moist.     Palate: No mass.     Pharynx: Oropharynx is clear. Uvula midline. Posterior oropharyngeal erythema present. No pharyngeal swelling, oropharyngeal exudate or uvula swelling.     Tonsils: Tonsillar exudate present. No tonsillar abscesses. 2+ on the right. 2+ on the left.  Eyes:     General:        Right eye: No discharge.        Left eye: No discharge.     Conjunctiva/sclera: Conjunctivae normal.  Neck:     Comments: Neck very supple Cardiovascular:     Rate and Rhythm: Normal rate and regular rhythm.     Heart sounds: No murmur heard. Pulmonary:     Effort: Pulmonary effort is normal. No respiratory distress.     Breath sounds: Normal breath sounds.  Abdominal:     General: Bowel sounds are normal.     Palpations: Abdomen is soft.     Tenderness: There is no abdominal tenderness.  Musculoskeletal:        General: No swelling.     Cervical back: Neck supple.  Lymphadenopathy:     Cervical: Cervical adenopathy present.  Skin:    General: Skin is warm and dry.     Capillary Refill: Capillary refill takes less than 2 seconds.     Findings: Rash present.          Comments: 1-2 mm scattered, flesh colored papules that are subjectively pruritic.  No crusting.  Not vesicular or urticarial.  No erythema, swelling, tenderness, ecchymosis, abscess, burns, or petechiae appreciated.  Neurological:     Mental Status: She is alert and oriented to person, place, and time.  Psychiatric:        Mood and Affect: Mood normal.    ED Results / Procedures / Treatments   Labs (all labs ordered are listed, but only abnormal results are  displayed) Labs Reviewed  GROUP A STREP BY PCR  RESP PANEL BY RT-PCR (FLU A&B, COVID) ARPGX2    EKG None  Radiology No results found.  Procedures Procedures  {Document cardiac monitor, telemetry assessment procedure when appropriate:1}  Medications Ordered in ED Medications  alum & mag hydroxide-simeth (MAALOX/MYLANTA) 200-200-20 MG/5ML suspension 30 mL (has no administration in time range)    And  lidocaine (XYLOCAINE) 2 % viscous mouth solution 15 mL (has no administration in time range)  ketorolac (TORADOL) 30 MG/ML injection 30  mg (has no administration in time range)  dexamethasone (DECADRON) injection 10 mg (has no administration in time range)    ED Course/ Medical Decision Making/ A&P                           Medical Decision Making Amount and/or Complexity of Data Reviewed External Data Reviewed: notes. Labs: ordered. Decision-making details documented in ED Course. Radiology:  Decision-making details documented in ED Course. ECG/medicine tests:  Decision-making details documented in ED Course.  Risk OTC drugs. Prescription drug management.   41 y.o. female presents to the ED for concern of Sore Throat (/)   This involves an extensive number of treatment options, and is a complaint that carries with it a high risk of complications and morbidity.    Past Medical History / Co-morbidities / Social History: Hx of asthma and seizures.  Recent contact with multiple persons who have tested positive for strep with similar symptoms.  Additional History:  Internal and external records from outside source obtained and reviewed including ED visits  Physical Exam: Physical exam performed. The pertinent findings include: Erythematous, swollen, exudative tonsils.  Cervical adenopathy bilaterally.  Mild small flesh-colored rash of the upper extremities.  Not observed any anywhere else on the body.  Lab Tests: I ordered, and personally interpreted labs.  The pertinent  results include:   Covid/Flu: Strep:  Imaging Studies: None  Medications: I ordered medication including decadron, toradol, and GI cocktail for symptom relief.  Reevaluation of the patient after these medicines showed that the patient tolerated this well without difficulty.  I have reviewed the patients home medicines and have made adjustments as needed  ED Course/Disposition: Pt well-appearing on exam.  ***  Pt currently afebrile, but has had fever on and off over the last 24-48 hours.  Presents with tonsillar exudate, cervical lymphadenopathy, & dysphagia.  Diagnosis of bacterial pharyngitis.   Strep positive ***.  Treated in the ED with steroids, NSAIDs, GI cocktail, and PCN IM***.  Pt appears mildly dehydrated, discussed importance of water rehydration.  Presentation non concerning for PTA or RPA.  ABCs intact.  No trismus or uvula deviation.  Not suspicious of SJS/TENS, herpes zoster.  Oral mucosa, palms, and soles spared.  Not suspicious of syphilis, rocky mountain spotted fever, hand foot and mouth, or erythema multiforme.  Overall I am unsure of the etiology of the rash, but based on presentation, history, and physical exam, I do not believe this presents a medical emergency at this time.  Specific return precautions discussed.  Pt able to drink water in ED without difficulty with intact air way.  Recommended to continue with tomorrow's PCP follow up.  Pt in NAD and good condition at time of discharge.  After consideration of the diagnostic results and the patient's encounter today, I feel that the emergency department workup does not suggest an emergent condition requiring admission or immediate intervention beyond what has been performed at this time.  The patient is safe for discharge and has been instructed to return immediately for worsening symptoms, change in symptoms or any other concerns.  Discussed course of treatment thoroughly with the patient, whom demonstrated understanding.   Patient in agreement and has no further questions.  I discussed this case with my attending physician Dr. Estell Harpin, who agreed with the proposed treatment course and cosigned this note including patient's presenting symptoms, physical exam, and planned diagnostics and interventions.  Attending physician stated agreement with plan or  made changes to plan which were implemented.     This chart was dictated using voice recognition software.  Despite best efforts to proofread, errors can occur which can change the documentation meaning.   {Document critical care time when appropriate:1} {Document review of labs and clinical decision tools ie heart score, Chads2Vasc2 etc:1}  {Document your independent review of radiology images, and any outside records:1} {Document your discussion with family members, caretakers, and with consultants:1} {Document social determinants of health affecting pt's care:1} {Document your decision making why or why not admission, treatments were needed:1} Final Clinical Impression(s) / ED Diagnoses Final diagnoses:  None    Rx / DC Orders ED Discharge Orders          Ordered    magic mouthwash (lidocaine, diphenhydrAMINE, alum & mag hydroxide) suspension  4 times daily PRN        11/17/21 1927

## 2021-11-17 NOTE — Discharge Instructions (Addendum)
Continue your follow-up with your PCP tomorrow as planned for re-evaluation and continued medical management.  A prescription for Magic mouthwash has been sent to your pharmacy.  You may take 5 mL every 6-8 hours as needed for sore throat.  There is Benadryl and this medication, which may cause drowsiness.  If this drowsiness feels more than you can tolerate, you may stop taking this medication at any time.  Your strep came back negative.  He may continue to manage symptoms with ibuprofen and Tylenol, tea with honey, rest, and rehydration.  Return to the ED for new or worsening symptoms as discussed.

## 2021-12-28 ENCOUNTER — Other Ambulatory Visit: Payer: Self-pay

## 2021-12-28 ENCOUNTER — Emergency Department (HOSPITAL_COMMUNITY): Payer: Medicaid Other

## 2021-12-28 ENCOUNTER — Emergency Department (HOSPITAL_COMMUNITY)
Admission: EM | Admit: 2021-12-28 | Discharge: 2021-12-28 | Disposition: A | Discharge status: Home / Self Care | Payer: Medicaid Other | Attending: Emergency Medicine | Admitting: Emergency Medicine

## 2021-12-28 ENCOUNTER — Encounter (HOSPITAL_COMMUNITY): Payer: Self-pay

## 2021-12-28 DIAGNOSIS — W19XXXA Unspecified fall, initial encounter: Secondary | ICD-10-CM

## 2021-12-28 DIAGNOSIS — Z91010 Allergy to peanuts: Secondary | ICD-10-CM | POA: Insufficient documentation

## 2021-12-28 DIAGNOSIS — M47816 Spondylosis without myelopathy or radiculopathy, lumbar region: Secondary | ICD-10-CM | POA: Insufficient documentation

## 2021-12-28 DIAGNOSIS — M25551 Pain in right hip: Secondary | ICD-10-CM | POA: Insufficient documentation

## 2021-12-28 DIAGNOSIS — M545 Low back pain, unspecified: Secondary | ICD-10-CM

## 2021-12-28 DIAGNOSIS — W010XXA Fall on same level from slipping, tripping and stumbling without subsequent striking against object, initial encounter: Secondary | ICD-10-CM | POA: Insufficient documentation

## 2021-12-28 DIAGNOSIS — M5126 Other intervertebral disc displacement, lumbar region: Secondary | ICD-10-CM | POA: Insufficient documentation

## 2021-12-28 DIAGNOSIS — R262 Difficulty in walking, not elsewhere classified: Secondary | ICD-10-CM | POA: Insufficient documentation

## 2021-12-28 LAB — PREGNANCY, URINE: Preg Test, Ur: NEGATIVE

## 2021-12-28 MED ORDER — NAPROXEN 500 MG PO TABS: 500.0000 mg | ORAL_TABLET | Freq: Two times a day (BID) | ORAL | 0 refills | Status: DC | PRN

## 2021-12-28 MED ORDER — METHOCARBAMOL 500 MG PO TABS: 1000.0000 mg | ORAL_TABLET | Freq: Every evening | ORAL | 0 refills | Status: DC | PRN

## 2021-12-28 MED ORDER — OXYCODONE-ACETAMINOPHEN 5-325 MG PO TABS: 1.0000 | ORAL_TABLET | Freq: Four times a day (QID) | ORAL | 0 refills | Status: DC | PRN

## 2021-12-28 NOTE — Discharge Instructions (Addendum)
You were seen in the emergency department for evaluation of low back pain after a fall.  You had a CAT scan of your thoracic and lumbar spine that did not show any acute traumatic issues.  We are treating you with some anti-inflammatories, muscle relaxant and pain medication.  Please drink plenty of fluids and take a stool softener.  Contact your primary care doctor for close follow-up.  You may require further imaging and physical therapy.  Return if any worsening or concerning symptoms

## 2021-12-28 NOTE — ED Provider Triage Note (Signed)
Emergency Medicine Provider Triage Evaluation Note  Kalin Amrhein , a 41 y.o. female  was evaluated in triage.  Pt complains of low back pain after fall (mechanical). The fall occurred June 22nd. Pain since. States too bad to walk.   Landed on buttucks  Review of Systems  Positive: Hip pain, back pain Negative: Fever   Physical Exam  BP 97/73 (BP Location: Left Arm)   Pulse 94   Temp 98.5 F (36.9 C) (Oral)   Resp 16   Ht 5\' 5"  (1.651 m)   Wt 86.2 kg   LMP 12/01/2021 (Approximate)   SpO2 97%   BMI 31.62 kg/m  Gen:   Awake, no distress   Resp:  Normal effort  MSK:   Moves extremities without difficulty  Other:  T/L spine TTP. BL hips w mild Discomfort   Medical Decision Making  Medically screening exam initiated at 2:53 PM.  Appropriate orders placed.  Alyviah Crandle was informed that the remainder of the evaluation will be completed by another provider, this initial triage assessment does not replace that evaluation, and the importance of remaining in the ED until their evaluation is complete.  CT L and T spine.  DG hips, urine preg   George Hugh, Gailen Shelter 12/28/21 1455

## 2021-12-28 NOTE — ED Triage Notes (Signed)
Patient states she slipped on a wet floor at home a week ago. Patient c/o right lower back pain. Patient states she had surgery years ago on the same side. Patient denies hitting her head or taking any blood thinners.

## 2021-12-28 NOTE — ED Provider Notes (Signed)
[facility]-EMERGENCY DEPT Provider Note   CSN: [id number] Arrival date & time: 12/28/21  1351     History  Chief Complaint  Patient presents with   Marletta Lor    Lisa Holder is a 41 y.o. female.  She had a slip and fall about a week and a half ago.  Since then has had severe low back pain.  Has seen her PCP for it and is tried multiple medications without improvement.  Prior history of back surgery in [place].  No bowel or bladder incontinence.  Has been constipated.  No numbness or weakness.  The history is provided by the patient.  Fall This is a new problem. The current episode started more than 1 week ago. The problem has not changed since onset.Pertinent negatives include no chest pain, no abdominal pain, no headaches and no shortness of breath. Associated symptoms comments: Low back pain. Nothing aggravates the symptoms. Nothing relieves the symptoms. She has tried rest (NSAIDs narcotics) for the symptoms. The treatment provided no relief.       Home Medications Prior to Admission medications   Medication Sig Start Date End Date Taking? Authorizing Provider  albuterol (PROVENTIL) (5 MG/ML) 0.5% nebulizer solution Take 0.5 mLs (2.5 mg total) by nebulization every 6 (six) hours as needed for wheezing or shortness of breath. 06/14/21   Tilden Fossa, MD  albuterol (VENTOLIN HFA) 108 (90 Base) MCG/ACT inhaler Inhale 1-2 puffs into the lungs every 6 (six) hours as needed for wheezing or shortness of breath. 08/18/21   Sponseller, Lupe Carney R, PA-C  benzonatate (TESSALON) 100 MG capsule Take 1 capsule (100 mg total) by mouth 3 (three) times daily as needed for cough. 06/14/21   Tilden Fossa, MD  lidocaine (LIDODERM) 5 % Place 1 patch onto the skin daily as needed. Apply patch to area most significant pain once per day.  Remove and discard patch within 12 hours of application. 08/08/20   Petrucelli, Samantha R, PA-C  magic mouthwash (lidocaine, diphenhydrAMINE, alum &  mag hydroxide) suspension Swish and swallow 5 mLs 4 (four) times daily as needed for up to 10 doses for mouth pain. 11/17/21   Cecil Cobbs, PA-C  methocarbamol (ROBAXIN) 500 MG tablet Take 2 tablets (1,000 mg total) by mouth at bedtime as needed for muscle spasms. 01/20/21   Renne Crigler, PA-C  naproxen (NAPROSYN) 500 MG tablet Take 1 tablet (500 mg total) by mouth 2 (two) times daily as needed. 08/08/20   Petrucelli, Samantha R, PA-C  ondansetron (ZOFRAN-ODT) 4 MG disintegrating tablet Take 1 tablet (4 mg total) by mouth every 8 (eight) hours as needed for nausea or vomiting. 08/18/21   Sponseller, Lupe Carney R, PA-C  oxyCODONE-acetaminophen (PERCOCET/ROXICET) 5-325 MG tablet Take 1-2 tablets by mouth every 6 (six) hours as needed for severe pain. 08/08/20   Petrucelli, Samantha R, PA-C  predniSONE (STERAPRED UNI-PAK 21 TAB) 10 MG (21) TBPK tablet Take by mouth daily. Take 6 tabs by mouth daily  for 1 days, then 5 tabs for 1 days, then 4 tabs for 1 days, then 3 tabs for 1 days, 2 tabs for 1 days, then 1 tab by mouth daily for 1 days 08/18/21   Sponseller, Rebekah R, PA-C      Allergies    Peanut-containing drug products    Review of Systems   Review of Systems  Constitutional:  Negative for fever.  Respiratory:  Negative for shortness of breath.   Cardiovascular:  Negative for chest pain.  Gastrointestinal:  Positive  for constipation. Negative for abdominal pain.  Musculoskeletal:  Positive for back pain.  Neurological:  Negative for weakness, numbness and headaches.    Physical Exam Updated Vital Signs BP 109/69   Pulse 88   Temp 98.9 F (37.2 C) (Oral)   Resp 16   Ht 5\' 5"  (1.651 m)   Wt 86.2 kg   LMP 12/01/2021 (Approximate)   SpO2 91%   BMI 31.62 kg/m  Physical Exam Vitals and nursing note reviewed.  Constitutional:      General: She is not in acute distress.    Appearance: Normal appearance. She is well-developed.  HENT:     Head: Normocephalic and atraumatic.  Eyes:      Conjunctiva/sclera: Conjunctivae normal.  Cardiovascular:     Rate and Rhythm: Normal rate and regular rhythm.     Heart sounds: No murmur heard. Pulmonary:     Effort: Pulmonary effort is normal. No respiratory distress.     Breath sounds: Normal breath sounds.  Abdominal:     Palpations: Abdomen is soft.     Tenderness: There is no abdominal tenderness. There is no guarding or rebound.  Musculoskeletal:        General: Tenderness present.     Cervical back: Neck supple.     Comments: She has midline tenderness from her lower thoracic throughout her lumbar spine.  No step-offs.  Skin:    General: Skin is warm and dry.     Capillary Refill: Capillary refill takes less than 2 seconds.  Neurological:     General: No focal deficit present.     Mental Status: She is alert.     Sensory: No sensory deficit.     Motor: No weakness.     ED Results / Procedures / Treatments   Labs (all labs ordered are listed, but only abnormal results are displayed) Labs Reviewed  PREGNANCY, URINE    EKG None  Radiology DG Hip Unilat W or Wo Pelvis 2-3 Views Right  Result Date: 12/28/2021 CLINICAL DATA:  Right hip pain after fall 1 week ago EXAM: DG HIP (WITH OR WITHOUT PELVIS) 2-3V RIGHT COMPARISON:  None Available. FINDINGS: There is no evidence of hip fracture or dislocation. There is no evidence of arthropathy or other focal bone abnormality. IMPRESSION: Negative. Electronically Signed   By: 02/28/2022 D.O.   On: 12/28/2021 17:01   CT Lumbar Spine Wo Contrast  Result Date: 12/28/2021 CLINICAL DATA:  Mid to low back pain after falling. EXAM: CT THORACIC AND LUMBAR SPINE WITHOUT CONTRAST TECHNIQUE: Multidetector CT imaging of the thoracic and lumbar spine was performed without contrast. Multiplanar CT image reconstructions were also generated. RADIATION DOSE REDUCTION: This exam was performed according to the departmental dose-optimization program which includes automated exposure  control, adjustment of the mA and/or kV according to patient size and/or use of iterative reconstruction technique. COMPARISON:  Chest radiographs 06/13/2021. Lumbar MRI 03/30/2021 and abdominopelvic CT 08/08/2020. Lumbar spine CT 08/08/2020. FINDINGS: CT THORACIC SPINE FINDINGS Alignment: Normal. Vertebrae: No evidence of acute fracture or traumatic subluxation. Paraspinal and other soft tissues: The paraspinal soft tissues appear unremarkable. The visualized lungs and mediastinum appear unremarkable. Disc levels: The disc heights are maintained. No evidence of large disc herniation or significant spinal stenosis. CT LUMBAR SPINE FINDINGS Segmentation: There are 5 lumbar type vertebral bodies. Alignment: Straightening without focal angulation or listhesis. Vertebrae: No evidence of acute fracture or pars defect. Previously demonstrated fractures of the right L2 and L3 transverse processes have healed. Status  post L4-5 posterior lumbar and interbody fusion with intact hardware and solid interbody fusion. Stable chronic endplate degenerative changes asymmetric to the right at L3-4. Paraspinal and other soft tissues: The paraspinal soft tissues appear unremarkable. Disc levels: L2-3: Stable small central disc protrusion. L3-4: Stable shallow central disc protrusion without resulting significant spinal stenosis. L4-5: Solid interbody fusion post PLIF. No evidence of spinal stenosis. L5-S1: Mild disc bulging and facet hypertrophy. No significant spinal stenosis. IMPRESSION: 1. No acute findings identified in the thoracolumbar spine. 2. Solid interbody fusion post L4-5 PLIF. 3. Small disc protrusions at L2-3 and L3-4 are similar to previous lumbar MRI. No large disc herniation or spinal stenosis identified. Electronically Signed   By: Carey Bullocks M.D.   On: 12/28/2021 16:32   CT Thoracic Spine Wo Contrast  Result Date: 12/28/2021 CLINICAL DATA:  Mid to low back pain after falling. EXAM: CT THORACIC AND LUMBAR  SPINE WITHOUT CONTRAST TECHNIQUE: Multidetector CT imaging of the thoracic and lumbar spine was performed without contrast. Multiplanar CT image reconstructions were also generated. RADIATION DOSE REDUCTION: This exam was performed according to the departmental dose-optimization program which includes automated exposure control, adjustment of the mA and/or kV according to patient size and/or use of iterative reconstruction technique. COMPARISON:  Chest radiographs 06/13/2021. Lumbar MRI 03/30/2021 and abdominopelvic CT 08/08/2020. Lumbar spine CT 08/08/2020. FINDINGS: CT THORACIC SPINE FINDINGS Alignment: Normal. Vertebrae: No evidence of acute fracture or traumatic subluxation. Paraspinal and other soft tissues: The paraspinal soft tissues appear unremarkable. The visualized lungs and mediastinum appear unremarkable. Disc levels: The disc heights are maintained. No evidence of large disc herniation or significant spinal stenosis. CT LUMBAR SPINE FINDINGS Segmentation: There are 5 lumbar type vertebral bodies. Alignment: Straightening without focal angulation or listhesis. Vertebrae: No evidence of acute fracture or pars defect. Previously demonstrated fractures of the right L2 and L3 transverse processes have healed. Status post L4-5 posterior lumbar and interbody fusion with intact hardware and solid interbody fusion. Stable chronic endplate degenerative changes asymmetric to the right at L3-4. Paraspinal and other soft tissues: The paraspinal soft tissues appear unremarkable. Disc levels: L2-3: Stable small central disc protrusion. L3-4: Stable shallow central disc protrusion without resulting significant spinal stenosis. L4-5: Solid interbody fusion post PLIF. No evidence of spinal stenosis. L5-S1: Mild disc bulging and facet hypertrophy. No significant spinal stenosis. IMPRESSION: 1. No acute findings identified in the thoracolumbar spine. 2. Solid interbody fusion post L4-5 PLIF. 3. Small disc protrusions at  L2-3 and L3-4 are similar to previous lumbar MRI. No large disc herniation or spinal stenosis identified. Electronically Signed   By: Carey Bullocks M.D.   On: 12/28/2021 16:32    Procedures Procedures    Medications Ordered in ED Medications - No data to display  ED Course/ Medical Decision Making/ A&P                           Medical Decision Making Risk Prescription drug management.  41 year old female with chronic back issues back surgery here with worsening low back pain after a slip and fall.  [place] incontinence.  No numbness or weakness. Imaging does not show any acute fracture.  She is otherwise neurologically intact.  Will treat symptomatically with NSAIDs pain medication and muscle relaxant.  Recommended close follow-up with PCP.  Return instructions discussed         Final Clinical Impression(s) / ED Diagnoses Final diagnoses:  Fall, initial encounter  Acute midline low back  pain without sciatica    Rx / DC Orders ED Discharge Orders          Ordered    oxyCODONE-acetaminophen (PERCOCET/ROXICET) 5-325 MG tablet  Every 6 hours PRN        12/28/21 1957    methocarbamol (ROBAXIN) 500 MG tablet  At bedtime PRN        12/28/21 1957    naproxen (NAPROSYN) 500 MG tablet  2 times daily PRN        12/28/21 1957              Terrilee Files, MD 12/29/21 1147

## 2022-07-08 IMAGING — CR DG CHEST 2V
2 series · 2 of 2 positions shown · non-contrast
Comparison: 08/08/2020

CLINICAL DATA: Shortness of breath

EXAM:
CHEST - 2 VIEW

[w chest pa]
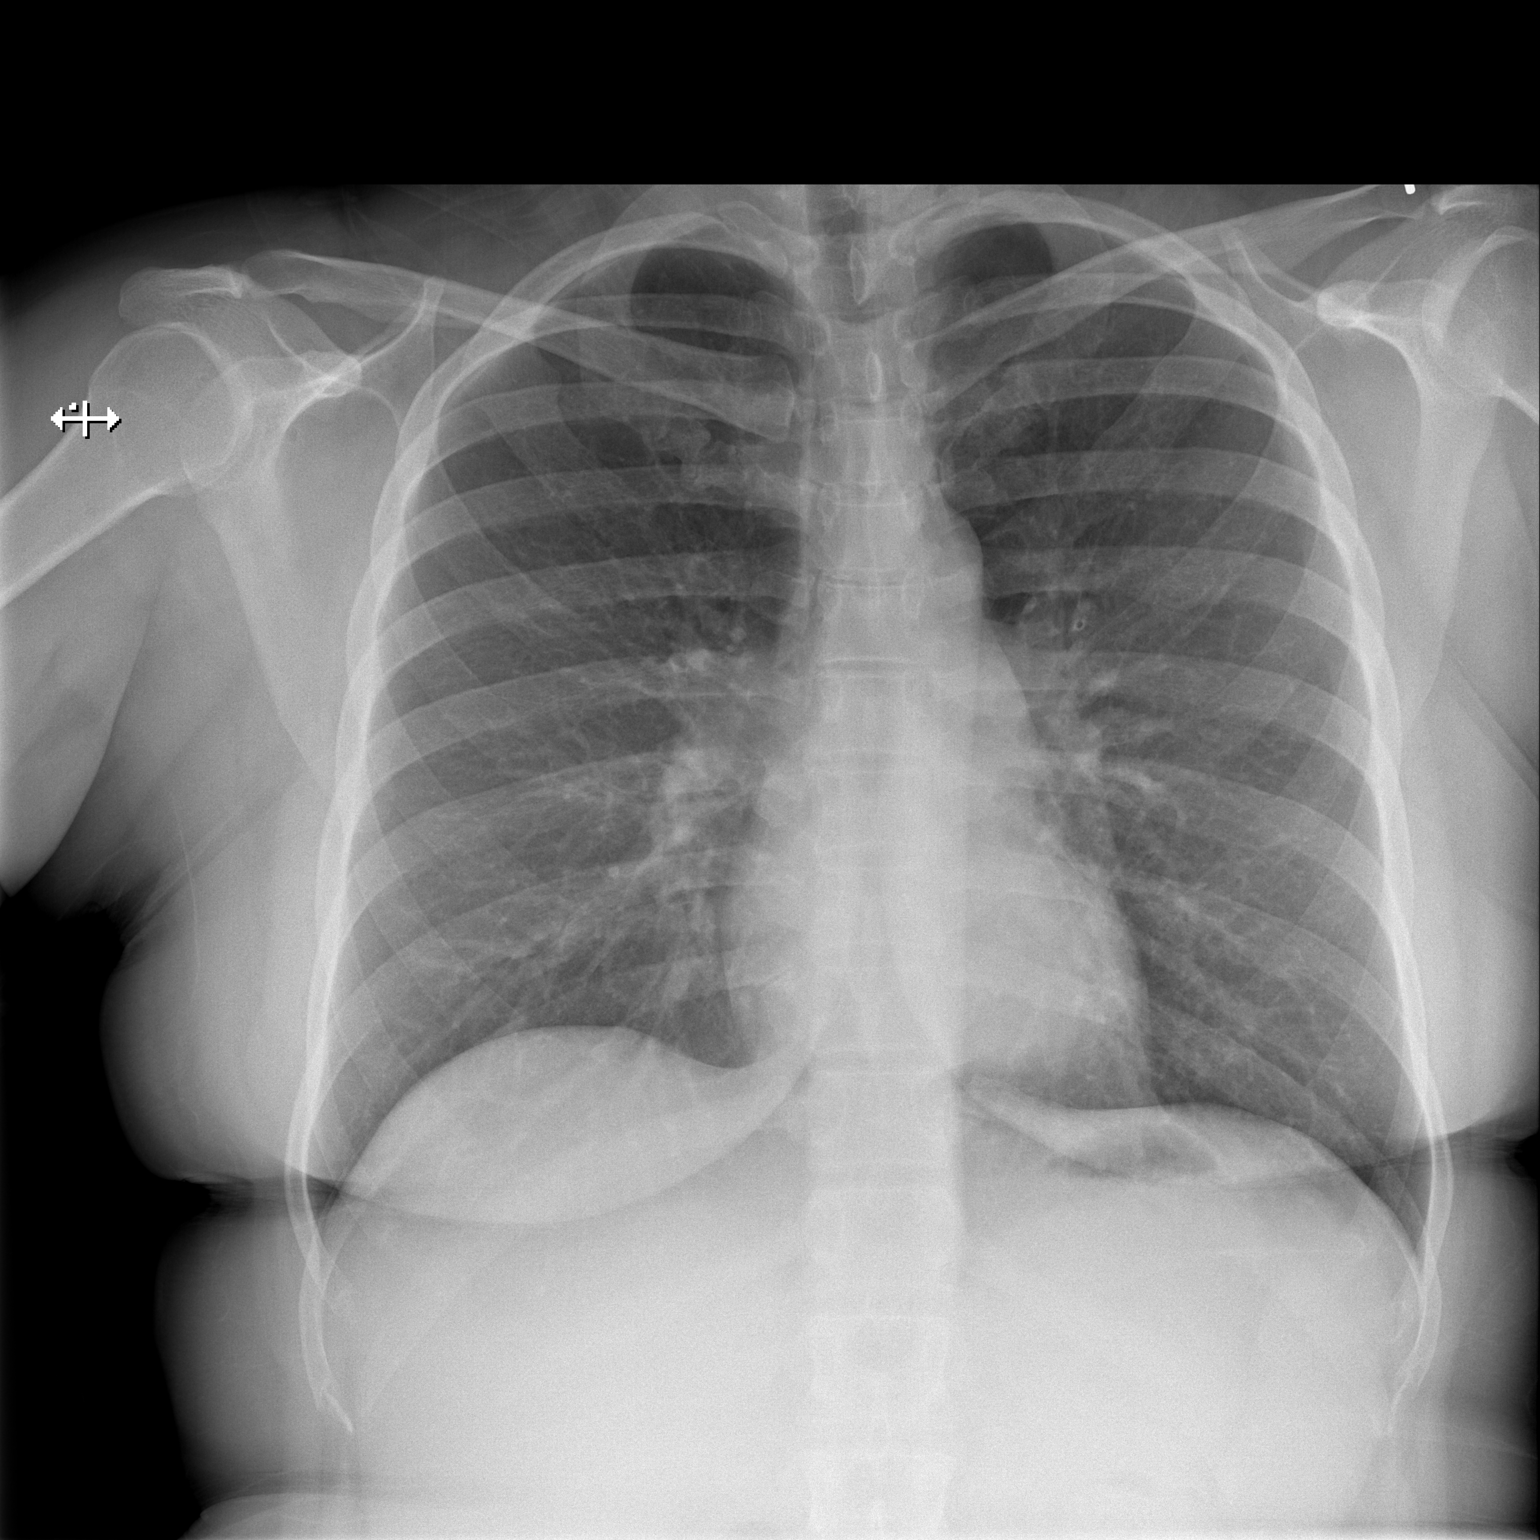

[w chest lat]
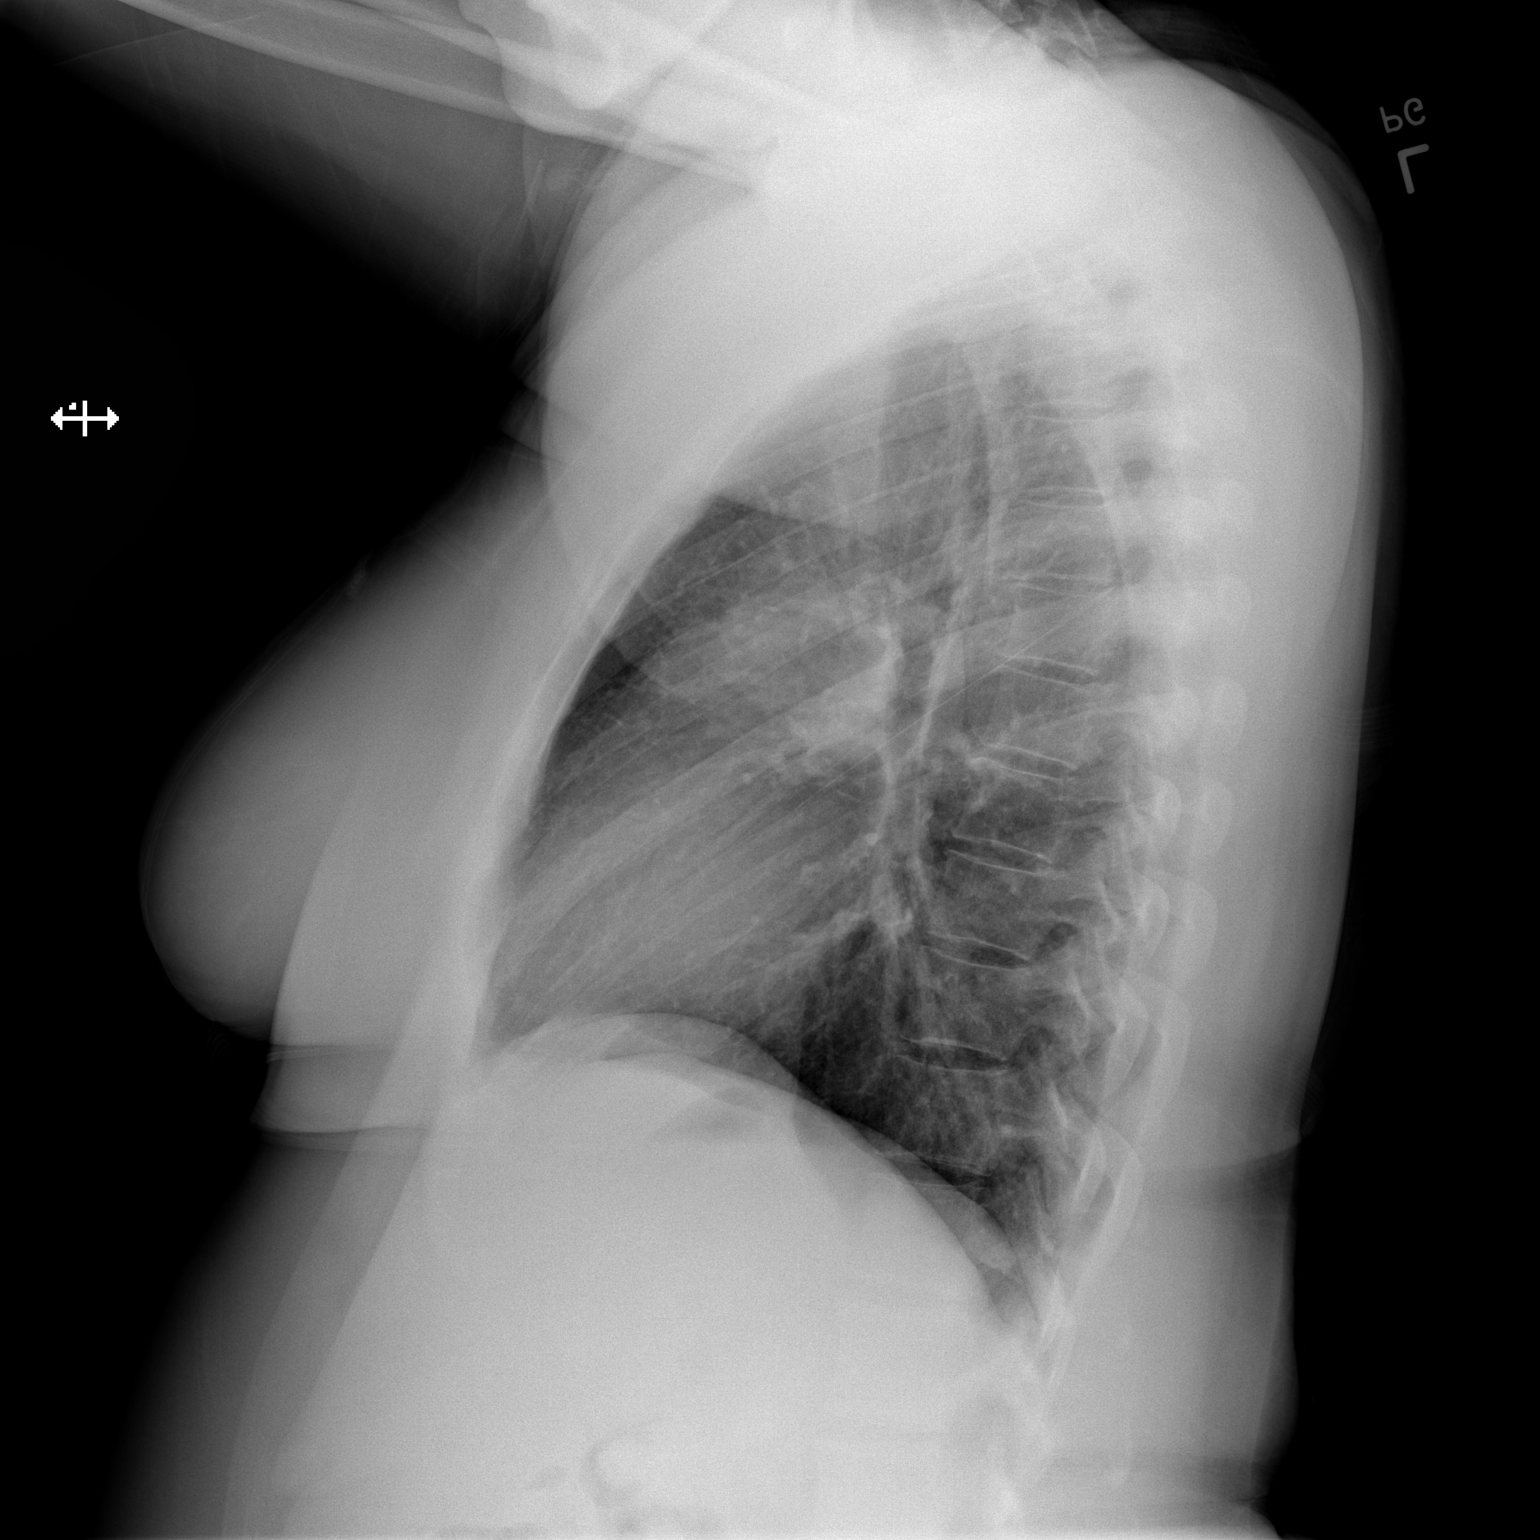

[2 of 2 positions shown; findings below may reference images not displayed]

FINDINGS: The heart size and mediastinal contours are within normal limits.
Both lungs are clear. The visualized skeletal structures are
unremarkable.
IMPRESSION: No active cardiopulmonary disease.

## 2023-01-27 ENCOUNTER — Other Ambulatory Visit: Payer: Self-pay | Admitting: Otolaryngology

## 2023-01-27 ENCOUNTER — Encounter (HOSPITAL_BASED_OUTPATIENT_CLINIC_OR_DEPARTMENT_OTHER): Payer: Self-pay | Admitting: Otolaryngology

## 2023-01-27 ENCOUNTER — Other Ambulatory Visit: Payer: Self-pay

## 2023-02-03 ENCOUNTER — Ambulatory Visit (HOSPITAL_BASED_OUTPATIENT_CLINIC_OR_DEPARTMENT_OTHER): Payer: Medicaid Other | Admitting: Anesthesiology

## 2023-02-03 ENCOUNTER — Ambulatory Visit (HOSPITAL_BASED_OUTPATIENT_CLINIC_OR_DEPARTMENT_OTHER)
Admission: RE | Admit: 2023-02-03 | Discharge: 2023-02-03 | Disposition: A | Discharge status: Home / Self Care | Payer: Medicaid Other | Attending: Otolaryngology | Admitting: Otolaryngology

## 2023-02-03 ENCOUNTER — Encounter (HOSPITAL_BASED_OUTPATIENT_CLINIC_OR_DEPARTMENT_OTHER)
Admission: RE | Disposition: A | Discharge status: Home / Self Care | Payer: Self-pay | Source: Home / Self Care | Attending: Otolaryngology

## 2023-02-03 ENCOUNTER — Encounter (HOSPITAL_BASED_OUTPATIENT_CLINIC_OR_DEPARTMENT_OTHER): Payer: Self-pay | Admitting: Otolaryngology

## 2023-02-03 DIAGNOSIS — J358 Other chronic diseases of tonsils and adenoids: Secondary | ICD-10-CM | POA: Diagnosis not present

## 2023-02-03 DIAGNOSIS — J0301 Acute recurrent streptococcal tonsillitis: Secondary | ICD-10-CM | POA: Diagnosis not present

## 2023-02-03 DIAGNOSIS — J45909 Unspecified asthma, uncomplicated: Secondary | ICD-10-CM | POA: Insufficient documentation

## 2023-02-03 DIAGNOSIS — Z01818 Encounter for other preprocedural examination: Secondary | ICD-10-CM

## 2023-02-03 DIAGNOSIS — Z9089 Acquired absence of other organs: Secondary | ICD-10-CM

## 2023-02-03 DIAGNOSIS — J0300 Acute streptococcal tonsillitis, unspecified: Secondary | ICD-10-CM | POA: Diagnosis not present

## 2023-02-03 HISTORY — PX: TONSILLECTOMY: SHX5217

## 2023-02-03 LAB — POCT PREGNANCY, URINE: Preg Test, Ur: NEGATIVE

## 2023-02-03 SURGERY — TONSILLECTOMY
Anesthesia: General | Site: Throat | Laterality: Bilateral

## 2023-02-03 MED ORDER — OXYCODONE HCL 5 MG PO TABS
5.0000 mg | ORAL_TABLET | Freq: Once | ORAL | Status: AC
  Administered 2023-02-03: 5 mg via ORAL

## 2023-02-03 MED ORDER — DEXMEDETOMIDINE HCL IN NACL 80 MCG/20ML IV SOLN
INTRAVENOUS | Status: DC | PRN
  Administered 2023-02-03: 8 ug via INTRAVENOUS

## 2023-02-03 MED ORDER — 0.9 % SODIUM CHLORIDE (POUR BTL) OPTIME
TOPICAL | Status: DC | PRN
  Administered 2023-02-03: 200 mL

## 2023-02-03 MED ORDER — FENTANYL CITRATE (PF) 100 MCG/2ML IJ SOLN
INTRAMUSCULAR | Status: AC
  Filled 2023-02-03: qty 2

## 2023-02-03 MED ORDER — ACETAMINOPHEN 500 MG PO TABS
ORAL_TABLET | ORAL | Status: AC
  Filled 2023-02-03: qty 2

## 2023-02-03 MED ORDER — PROPOFOL 10 MG/ML IV BOLUS
INTRAVENOUS | Status: DC | PRN
Start: 2023-02-03 — End: 2023-02-03
  Administered 2023-02-03: 200 mg via INTRAVENOUS

## 2023-02-03 MED ORDER — LIDOCAINE 2% (20 MG/ML) 5 ML SYRINGE
INTRAMUSCULAR | Status: AC
  Filled 2023-02-03: qty 5

## 2023-02-03 MED ORDER — ROCURONIUM BROMIDE 10 MG/ML (PF) SYRINGE
PREFILLED_SYRINGE | INTRAVENOUS | Status: AC
  Filled 2023-02-03: qty 10

## 2023-02-03 MED ORDER — LIDOCAINE HCL (CARDIAC) PF 100 MG/5ML IV SOSY
PREFILLED_SYRINGE | INTRAVENOUS | Status: DC | PRN
  Administered 2023-02-03: 100 mg via INTRAVENOUS

## 2023-02-03 MED ORDER — PROPOFOL 10 MG/ML IV BOLUS
INTRAVENOUS | Status: AC
  Filled 2023-02-03: qty 20

## 2023-02-03 MED ORDER — LACTATED RINGERS IV SOLN: INTRAVENOUS | Status: DC

## 2023-02-03 MED ORDER — FENTANYL CITRATE (PF) 100 MCG/2ML IJ SOLN
INTRAMUSCULAR | Status: DC | PRN
  Administered 2023-02-03 (×4): 50 ug via INTRAVENOUS

## 2023-02-03 MED ORDER — ACETAMINOPHEN 500 MG PO TABS: 500.0000 mg | ORAL_TABLET | Freq: Four times a day (QID) | ORAL | 0 refills | Status: AC

## 2023-02-03 MED ORDER — OXYCODONE HCL 5 MG PO TABS
ORAL_TABLET | ORAL | Status: AC
  Filled 2023-02-03: qty 1

## 2023-02-03 MED ORDER — ACETAMINOPHEN 500 MG PO TABS
1000.0000 mg | ORAL_TABLET | Freq: Once | ORAL | Status: AC
  Administered 2023-02-03: 1000 mg via ORAL

## 2023-02-03 MED ORDER — ONDANSETRON HCL 4 MG/2ML IJ SOLN
INTRAMUSCULAR | Status: DC | PRN
  Administered 2023-02-03: 4 mg via INTRAVENOUS

## 2023-02-03 MED ORDER — FENTANYL CITRATE (PF) 100 MCG/2ML IJ SOLN
25.0000 ug | INTRAMUSCULAR | Status: DC | PRN
  Administered 2023-02-03 (×2): 50 ug via INTRAVENOUS

## 2023-02-03 MED ORDER — IBUPROFEN 200 MG PO TABS: 400.0000 mg | ORAL_TABLET | Freq: Four times a day (QID) | ORAL | 0 refills | Status: AC

## 2023-02-03 MED ORDER — ROCURONIUM BROMIDE 100 MG/10ML IV SOLN
INTRAVENOUS | Status: DC | PRN
  Administered 2023-02-03: 50 mg via INTRAVENOUS

## 2023-02-03 MED ORDER — MIDAZOLAM HCL 2 MG/2ML IJ SOLN
INTRAMUSCULAR | Status: AC
  Filled 2023-02-03: qty 2

## 2023-02-03 MED ORDER — BUPIVACAINE-EPINEPHRINE (PF) 0.25% -1:200000 IJ SOLN
INTRAMUSCULAR | Status: DC | PRN
  Administered 2023-02-03: 4 mL

## 2023-02-03 MED ORDER — METHYLPREDNISOLONE 4 MG PO TBPK: ORAL_TABLET | ORAL | 0 refills | Status: AC

## 2023-02-03 MED ORDER — MIDAZOLAM HCL 5 MG/5ML IJ SOLN
INTRAMUSCULAR | Status: DC | PRN
  Administered 2023-02-03: 2 mg via INTRAVENOUS

## 2023-02-03 MED ORDER — OXYCODONE HCL 5 MG PO TABS: 5.0000 mg | ORAL_TABLET | Freq: Four times a day (QID) | ORAL | 0 refills | Status: AC | PRN

## 2023-02-03 MED ORDER — SUGAMMADEX SODIUM 200 MG/2ML IV SOLN
INTRAVENOUS | Status: DC | PRN
Start: 2023-02-03 — End: 2023-02-03
  Administered 2023-02-03: 200 mg via INTRAVENOUS

## 2023-02-03 MED ORDER — DEXAMETHASONE SODIUM PHOSPHATE 4 MG/ML IJ SOLN
INTRAMUSCULAR | Status: DC | PRN
  Administered 2023-02-03: 5 mg via INTRAVENOUS

## 2023-02-03 SURGICAL SUPPLY — 34 items
CANISTER SUCT 1200ML W/VALVE (MISCELLANEOUS) ×1 IMPLANT
CATH ROBINSON RED A/P 10FR (CATHETERS) ×1 IMPLANT
CLEANER CAUTERY TIP 5X5 PAD (MISCELLANEOUS) ×1 IMPLANT
COAGULATOR SUCT SWTCH 10FR 6 (ELECTROSURGICAL) ×1 IMPLANT
CORD BIPOLAR FORCEPS 12FT (ELECTRODE) IMPLANT
COVER BACK TABLE 60X90IN (DRAPES) ×1 IMPLANT
COVER MAYO STAND STRL (DRAPES) ×1 IMPLANT
DEFOGGER MIRROR 1QT (MISCELLANEOUS) ×1 IMPLANT
ELECT COATED BLADE 2.86 ST (ELECTRODE) ×1 IMPLANT
ELECT REM PT RETURN 9FT ADLT (ELECTROSURGICAL) ×1
ELECT REM PT RETURN 9FT PED (ELECTROSURGICAL)
ELECTRODE REM PT RETRN 9FT PED (ELECTROSURGICAL) IMPLANT
ELECTRODE REM PT RTRN 9FT ADLT (ELECTROSURGICAL) IMPLANT
FORCEPS BIPOLAR SPETZLER 8 1.0 (NEUROSURGERY SUPPLIES) IMPLANT
GAUZE SPONGE 4X4 12PLY STRL LF (GAUZE/BANDAGES/DRESSINGS) ×1 IMPLANT
GLOVE BIO SURGEON STRL SZ7.5 (GLOVE) ×1 IMPLANT
GLOVE BIOGEL PI IND STRL 8 (GLOVE) IMPLANT
GOWN STRL REUS W/ TWL LRG LVL3 (GOWN DISPOSABLE) IMPLANT
GOWN STRL REUS W/TWL LRG LVL3 (GOWN DISPOSABLE)
KIT TURNOVER KIT B (KITS) ×1 IMPLANT
MANIFOLD NEPTUNE II (INSTRUMENTS) IMPLANT
MARKER SKIN DUAL TIP RULER LAB (MISCELLANEOUS) ×1 IMPLANT
NDL HYPO 27GX1-1/4 (NEEDLE) ×1 IMPLANT
NEEDLE HYPO 27GX1-1/4 (NEEDLE) ×1
NS IRRIG 1000ML POUR BTL (IV SOLUTION) ×1 IMPLANT
PENCIL SMOKE EVACUATOR (MISCELLANEOUS) ×1 IMPLANT
SHEET MEDIUM DRAPE 40X70 STRL (DRAPES) ×2 IMPLANT
SPONGE TONSIL 1.25 RF SGL STRG (GAUZE/BANDAGES/DRESSINGS) ×1 IMPLANT
SYR 5ML LL (SYRINGE) IMPLANT
SYR BULB EAR ULCER 3OZ GRN STR (SYRINGE) ×1 IMPLANT
TOWEL GREEN STERILE FF (TOWEL DISPOSABLE) ×1 IMPLANT
TUBE CONNECTING 20X1/4 (TUBING) ×2 IMPLANT
TUBE SALEM SUMP 16F (TUBING) ×1 IMPLANT
YANKAUER SUCT BULB TIP NO VENT (SUCTIONS) IMPLANT

## 2023-02-03 NOTE — Anesthesia Procedure Notes (Signed)
Procedure Name: Intubation Date/Time: 02/03/2023 12:03 PM  Performed by: Burna Cash, CRNAPre-anesthesia Checklist: Patient identified, Emergency Drugs available, Suction available and Patient being monitored Patient Re-evaluated:Patient Re-evaluated prior to induction Oxygen Delivery Method: Circle system utilized Preoxygenation: Pre-oxygenation with 100% oxygen Induction Type: IV induction Ventilation: Mask ventilation without difficulty Laryngoscope Size: Mac and 3 Grade View: Grade I Tube type: Oral Rae Tube size: 7.0 mm Number of attempts: 1 Airway Equipment and Method: Oral airway Placement Confirmation: ETT inserted through vocal cords under direct vision, positive ETCO2 and breath sounds checked- equal and bilateral Secured at: 19 cm Tube secured with: Tape Dental Injury: Teeth and Oropharynx as per pre-operative assessment

## 2023-02-03 NOTE — Op Note (Signed)
OPERATIVE NOTE  Lisa Holder Date/Time of Admission: 02/03/2023  9:34 AM  CSN: [id number];MRN:[id number] Attending Provider: Scarlette Ar, MD Room/Bed: [facility]SP/NONE DOB: [birth year] Age: 42 y.o.   Pre-Op Diagnosis: Recurrent tonsillitis; Tonsillolith; Tonsillar hypertrophy; Recurrent streptococcal tonsillitis  Post-Op Diagnosis: Recurrent tonsillitis; Tonsillolith; Tonsillar hypertrophy; Recurrent streptococcal tonsillitis  Procedure: Procedure(s): TONSILLECTOMY  Anesthesia: General  Surgeon(s): Mervin Kung, MD  Staff: Circulator: Raliegh Scarlet, RN Scrub Person: Donald Pore, CST; Griffin Basil, RN  Implants: * No implants in log *  Specimens: ID Type Source Tests Collected by Time Destination  1 : Right tonsil--GROSS ONLY, but in research study [facility] SURGICAL PATHOLOGY Scarlette Ar, MD 02/03/2023 1219   2 : Left tonsil--GROSS ONLY, but in research study Tissue [facility] SURGICAL PATHOLOGY Scarlette Ar, MD 02/03/2023 1221     Complications: none  EBL: 20 ML  IVF: Per anesthesia ML  Condition: stable  Operative Findings:  3+ Endophytic cryptic inflamed tonsils with hypervascular arterial bleeding bilaterally. Tonsil stones bilaterally.   Description of Operation:  Once operative consent was obtained, and the surgical site confirmed with the operating room team, the patient was brought back to the operating room and general endotracheal anesthesia was obtained. The patient was turned over to the ENT service. A Crow-Davis mouth gag was used to expose the oral cavity and oropharynx. A red rubber catheter was placed from the right nasal cavity to the oral cavity to retract the soft palate. Attention was first turned to the right tonsil, which was excised at the level of the capsule using electrocautery. Hemostasis was obtained. The mouth gag was released to allow for lingual reperfusion. The exact procedure was repeated on the  left side. The mouth gag was released to allow for lingual reperfusion. The tonsillar fossas were anesthetized with .25% marcaine with epinephrine  . The patient was relieved from oral suspension and then placed back in oral suspension to assure hemostasis, which was obtained after confirmation with valsalva x 2. An oral gastric tube was placed into the stomach and suctioned to reduce postoperative nausea. The patient was turned back over to the anesthesia service. The patient was then transferred to the PACU in stable condition.  Electronically signed by:  Scarlette Ar, MD  Staff Physician Facial Plastic & Reconstructive Surgery Otolaryngology - Head and Neck Surgery [facility]     Mervin Kung, MD [facility]  02/03/2023

## 2023-02-03 NOTE — Transfer of Care (Signed)
Immediate Anesthesia Transfer of Care Note  Patient: Lisa Holder  Procedure(s) Performed: TONSILLECTOMY (Bilateral: Throat)  Patient Location: PACU  Anesthesia Type:General  Level of Consciousness: awake, alert , and oriented  Airway & Oxygen Therapy: Patient Spontanous Breathing and Patient connected to face mask oxygen  Post-op Assessment: Report given to RN and Post -op Vital signs reviewed and stable  Post vital signs: Reviewed and stable  Last Vitals:  Vitals Value Taken Time  BP    Temp    Pulse    Resp    SpO2      Last Pain:  Vitals:   02/03/23 0959  TempSrc: Temporal  PainSc: 6          Complications: No notable events documented.

## 2023-02-03 NOTE — H&P (Signed)
Lisa Holder is an 42 y.o. female.    Chief Complaint:  Recurrent streptococcal tonsillitis.   HPI: Patient presents today for planned elective procedure.  He/she denies any interval change in history since office visit on 12/17/22.   Past Medical History:  Diagnosis Date   Asthma    Seizures (HCC)    pt reports no seizures for 3 years    Past Surgical History:  Procedure Laterality Date   BACK SURGERY     CESAREAN SECTION     SPINAL FUSION      Family History  Problem Relation Age of Onset   Diabetes Mother    Hypertension Mother    Diabetes Father    Hypertension Father     Social History:  reports that she has never smoked. She has never used smokeless tobacco. She reports that she does not currently use alcohol. She reports that she does not use drugs.  Allergies:  Allergies  Allergen Reactions   Peanut-Containing Drug Products     Medications Prior to Admission  Medication Sig Dispense Refill   albuterol (VENTOLIN HFA) 108 (90 Base) MCG/ACT inhaler Inhale 1-2 puffs into the lungs every 6 (six) hours as needed for wheezing or shortness of breath. 1 each 0   naproxen (NAPROSYN) 500 MG tablet Take 1 tablet (500 mg total) by mouth 2 (two) times daily as needed. 20 tablet 0   albuterol (PROVENTIL) (5 MG/ML) 0.5% nebulizer solution Take 0.5 mLs (2.5 mg total) by nebulization every 6 (six) hours as needed for wheezing or shortness of breath. 20 mL 0    Results for orders placed or performed during the hospital encounter of 02/03/23 (from the past 48 hour(s))  Pregnancy, urine POC     Status: None   Collection Time: 02/03/23 10:22 AM  Result Value Ref Range   Preg Test, Ur NEGATIVE NEGATIVE    Comment:        THE SENSITIVITY OF THIS METHODOLOGY IS >24 mIU/mL    No results found.  ROS: negative other than stated in HPI  Blood pressure 126/79, pulse 77, temperature 97.9 F (36.6 C), temperature source Temporal, resp. rate 20, height 5\' 5"  (1.651 m), weight 90.7  kg, last menstrual period 01/30/2023, SpO2 100%.  PHYSICAL EXAM: General: Resting comfortably in NAD  Lungs: Non-labored respiratinos  Studies Reviewed: none   Assessment/Plan Recurrent streptococcal tonsillitis Tonsillar hypertrophy  Proceed with Tonsillectomy. Informed consent obtained.     Electronically signed by:  Scarlette Ar, MD  Staff Physician Facial Plastic & Reconstructive Surgery Otolaryngology - Head and Neck Surgery [facility]  02/03/2023, 10:39 AM

## 2023-02-03 NOTE — Discharge Instructions (Addendum)
Tonsillectomy & Adenoidectomy Post Operative Instructions   Effects of Anesthesia Tonsillectomy (with or without Adenoidectomy) involves a brief anesthesia,  typically 20 - 60 minutes. Patients may be quite irritable for several hours after  surgery. If sedatives were given, some patients will remain sleepy for much of the  day. Nausea and vomiting is occasionally seen, and usually resolves by the  evening of surgery - even without additional medications. Medications Tonsillectomy is a painful procedure. Pain medications help but do not  completely alleviate the discomfort.   YOUNGER CHILDREN  Younger children should be given Tylenol Elixir and Motrin Elixir, with  dosing based on weight (see chart below). Start by giving scheduled  Tylenol every 6 hours. If this does not control the pain, you can  ALTERNATE between Tylenol and Motrin and give a dose every 3 hours  (i.e. Tylenol given at 12pm, then Motrin at 3pm then Tylenol at 6pm). Many  children do not like the taste of liquid medications, so you may substitute  Tylenol and Motrin chewables for elixir prescribed. Below are the doses for  both. It is fine to use generic store brands instead of brand name -- [employer] generic has a taste tolerated by most children. You do not  need to wait for your child to complain of pain to give them medication,  scheduled dosing of medications will control the pain more effectively.     ADULTS  Adults will be prescribed a narcotic pain pill or elixir (Percocet, Norco,  Vicodin, Lortab are some examples). Do not use aspirin products (Bayer's,  Goode powders, Excedrin) - they may increase the chance of bleeding.  Every time you take a dose of pain medication, do so with some food or full  liquid to prevent nausea. The best thing to take with the medication is a  cup of pudding or ice cream, a milkshake or cup of milk.   Activity  Vigorous exercise should be avoided for 14 days after surgery.  This risk of  bleeding is increased with increased activity and bleeding from where the tonsils  were removed can happen for up to 2 weeks after surgery. Baths and showers are fine. Many patients have reduced energy levels until their pain decreases and  they are taking in more nourishment and calories. You should not travel out of  the local area for a full 2 weeks after surgery in case you experience bleeding  after surgery.   Eating & Drinking Dehydration is the biggest enemy in the recovery period. It will increase the pain,  increase the risk of bleeding and delay the healing. It usually happens because  the pain of swallowing keeps the patient from drinking enough liquids. Therefore,  the key is to force fluids, and that works best when pain control is maximized. You cannot drink too much after having a tonsillectomy. The only drinks to avoid  are citrus like orange and grapefruit juices because they will burn the back of the  throat. Incentive charts with prizes work very well to get young children to drink  fluids and take their medications after surgery. Some patients will have a small  amount of liquid come out of their nose when they drink after surgery, this should  stop within a few weeks after surgery.  Although drinking is more important, eating is fine even the day of surgery but  avoid foods that are crunchy or have sharp edges. Dairy products may be taken,  if desired. You should avoid  acidic, salty and spicy foods (especially tomato  sauces). Chewing gum or bubble gum encourages swallowing and saliva flow,  and may even speed up the healing. Almost everyone loses some weight after  tonsillectomy (which is usually regained in the 2nd or 3rd week after surgery).  Drinking is far more important that eating in the first 14 days after surgery, so  concentrate on that first and foremost. Adequate liquid intake probably speeds  Recovery.  Other things.  Pain is usually the  worst in the morning; this can be avoided by overnight  medication administration if needed.  Since moisture helps soothe the healing throat, a room humidifier (hot or  cold) is suggested when the patient is sleeping.  Some patients feel pain relief with an ice collar to the neck (or a bag of  frozen peas or corn). Be careful to avoid placing cold plastic directly on the  skin - wrap in a paper towel or washcloth.   If the tonsils and adenoids are very large, the patient's voice may change  after surgery.  The recovery from tonsillectomy is a very painful period, often the worst  pain people can recall, so please be understanding and patient with  yourself, or the patient you are caring for. It is helpful to take pain  medicine during the night if the patient awakens-- the worst pain is usually  in the morning. The pain may seem to increase 2-5 days after surgery - this is normal when inflammation sets in. Please be aware that no  combination of medicines will eliminate the pain - the patient will need to  continue eating/drinking in spite of the remaining discomfort.  You should not travel outside of the local area for 14 days after surgery in  case significant bleeding occurs.   What should we expect after surgery? As previously mentioned, most patients have a significant amount of pain after  tonsillectomy, with pain resolving 7-14 days after surgery. Older children and  adults seem to have more discomfort. Most patients can go home the day of  surgery.  Ear pain: Many people will complain of earaches after tonsillectomy. This  is caused by referred pain coming from throat and not the ears. Give pain  medications and encourage liquid intake.  Fever: Many patients have a low-grade fever after tonsillectomy - up to  101.5 degrees (380 C.) for several days. Higher prolonged fever should be  reported to your surgeon.  Bad looking (and bad smelling) throat: After surgery, the place where   the tonsils were removed is covered with a white film, which is a moist  scab. This usually develops 3-5 days after surgery and falls off 10-14 days  after surgery and usually causes bad breath. There will be some redness  and swelling as well. The uvula (the part of the throat that hangs down in  the middle between the tonsils) is usually swollen for several days after  surgery.  Sore/bruised feeling of Tongue: This is common for the first few days  after surgery because the tongue is pushed out of the way to take out the  tonsils in surgery.  When should we call the doctor?  Nausea/Vomiting: This is a common side effect from General Anesthesia  and can last up to 24-36 hours after surgery. Try giving sips of clear liquids  like Sprite, water or apple juice then gradually increase fluid intake. If the  nausea or vomiting continues beyond this time frame, call the doctor's  office for medications that will help relieve the nausea and vomiting.  Bleeding: Significant bleeding is rare, but it happens to about 5% of  patients who have tonsillectomy. It may come from the nose, the mouth, or  be vomited or coughed up. Ice water mouthwashes may help stop or  reduce bleeding. If you have bleeding that does not stop, you should call  the office (during business hours) or the on call physician (evenings, weekends) or go to the emergency room if you are very concerned.   Dehydration: If there has been little or no liquids intake for 24 hours, the  patient may need to come to the hospital for IV fluids. Signs of dehydration  include lethargy, the lack of tears when crying, and reduced or very  concentrated urine output.  High Fever: If the patient has a consistent temperatures greater than 102,  or when accompanied by cough or difficulty breathing, you should call the  doctor's office.  If you run out of pain medication: Some patients run out of pain  medications prescribed after surgery. If you  need more, call the office DURING BUSINESS HOURS and more will be prescribed. Keep an eye  on your prescription so that you don't run out completely before you can  pick up more, especially before the weekend  Call [phone] to reach the on-call ENT Physician at [facility], Nose & Th[facility]at    Post Anesthesia Home Care Instructions  Activity: Get plenty of rest for the remainder of the day. A responsible individual must stay with you for 24 hours following the procedure.  For the next 24 hours, DO NOT: -Drive a car -Advertising copywriter -Drink alcoholic beverages -Take any medication unless instructed by your physician -Make any legal decisions or sign important papers.  Meals: Start with liquid foods such as gelatin or soup. Progress to regular foods as tolerated. Avoid greasy, spicy, heavy foods. If nausea and/or vomiting occur, drink only clear liquids until the nausea and/or vomiting subsides. Call your physician if vomiting continues.  Special Instructions/Symptoms: Your throat may feel dry or sore from the anesthesia or the breathing tube placed in your throat during surgery. If this causes discomfort, gargle with warm salt water. The discomfort should disappear within 24 hours.  If you had a scopolamine patch placed behind your ear for the management of post- operative nausea and/or vomiting:  1. The medication in the patch is effective for 72 hours, after which it should be removed.  Wrap patch in a tissue and discard in the trash. Wash hands thoroughly with soap and water. 2. You may remove the patch earlier than 72 hours if you experience unpleasant side effects which may include dry mouth, dizziness or visual disturbances. 3. Avoid touching the patch. Wash your hands with soap and water after contact with the patch.  No tylenol until after 6:00pm if needed today.

## 2023-02-03 NOTE — Anesthesia Preprocedure Evaluation (Addendum)
Anesthesia Evaluation  Patient identified by MRN, date of birth, ID band Patient awake    Reviewed: Allergy & Precautions, NPO status , Patient's Chart, lab work & pertinent test results  Airway Mallampati: I  TM Distance: >3 FB Neck ROM: Full    Dental no notable dental hx. (+) Teeth Intact, Dental Advisory Given   Pulmonary asthma    Pulmonary exam normal breath sounds clear to auscultation       Cardiovascular negative cardio ROS Normal cardiovascular exam Rhythm:Regular Rate:Normal     Neuro/Psych Seizures -, Well Controlled,   negative psych ROS   GI/Hepatic negative GI ROS, Neg liver ROS,,,  Endo/Other  negative endocrine ROS    Renal/GU negative Renal ROS  negative genitourinary   Musculoskeletal negative musculoskeletal ROS (+)    Abdominal   Peds  Hematology negative hematology ROS (+)   Anesthesia Other Findings   Reproductive/Obstetrics                             Anesthesia Physical Anesthesia Plan  ASA: 2  Anesthesia Plan: General   Post-op Pain Management: Tylenol PO (pre-op)*   Induction: Intravenous  PONV Risk Score and Plan: 3 and Midazolam, Dexamethasone and Ondansetron  Airway Management Planned: Oral ETT  Additional Equipment:   Intra-op Plan:   Post-operative Plan: Extubation in OR  Informed Consent: I have reviewed the patients History and Physical, chart, labs and discussed the procedure including the risks, benefits and alternatives for the proposed anesthesia with the patient or authorized representative who has indicated his/her understanding and acceptance.     Dental advisory given  Plan Discussed with: CRNA  Anesthesia Plan Comments:        Anesthesia Quick Evaluation

## 2023-02-04 ENCOUNTER — Encounter (HOSPITAL_BASED_OUTPATIENT_CLINIC_OR_DEPARTMENT_OTHER): Payer: Self-pay | Admitting: Otolaryngology

## 2023-02-04 NOTE — Anesthesia Postprocedure Evaluation (Signed)
Anesthesia Post Note  Patient: Lisa Holder  Procedure(s) Performed: TONSILLECTOMY (Bilateral: Throat)     Patient location during evaluation: PACU Anesthesia Type: General Level of consciousness: awake and alert Pain management: pain level controlled Vital Signs Assessment: post-procedure vital signs reviewed and stable Respiratory status: spontaneous breathing, nonlabored ventilation, respiratory function stable and patient connected to nasal cannula oxygen Cardiovascular status: blood pressure returned to baseline and stable Postop Assessment: no apparent nausea or vomiting Anesthetic complications: no  No notable events documented.  Last Vitals:  Vitals:   02/03/23 1400 02/03/23 1430  BP: 102/80 119/83  Pulse: 75 68  Resp: 15 20  Temp:  (!) 36.2 C  SpO2: 96% 98%    Last Pain:  Vitals:   02/03/23 1430  TempSrc:   PainSc: 7                   L 

## 2023-09-03 ENCOUNTER — Emergency Department (HOSPITAL_BASED_OUTPATIENT_CLINIC_OR_DEPARTMENT_OTHER)

## 2023-09-03 ENCOUNTER — Other Ambulatory Visit: Payer: Self-pay

## 2023-09-03 ENCOUNTER — Emergency Department (HOSPITAL_BASED_OUTPATIENT_CLINIC_OR_DEPARTMENT_OTHER)
Admission: EM | Admit: 2023-09-03 | Discharge: 2023-09-04 | Disposition: A | Discharge status: Home / Self Care | Attending: Emergency Medicine | Admitting: Emergency Medicine

## 2023-09-03 ENCOUNTER — Encounter (HOSPITAL_BASED_OUTPATIENT_CLINIC_OR_DEPARTMENT_OTHER): Payer: Self-pay | Admitting: Urology

## 2023-09-03 DIAGNOSIS — R29810 Facial weakness: Secondary | ICD-10-CM | POA: Diagnosis not present

## 2023-09-03 DIAGNOSIS — R200 Anesthesia of skin: Secondary | ICD-10-CM | POA: Insufficient documentation

## 2023-09-03 DIAGNOSIS — R29818 Other symptoms and signs involving the nervous system: Secondary | ICD-10-CM | POA: Insufficient documentation

## 2023-09-03 DIAGNOSIS — R569 Unspecified convulsions: Secondary | ICD-10-CM | POA: Diagnosis not present

## 2023-09-03 DIAGNOSIS — R531 Weakness: Secondary | ICD-10-CM | POA: Insufficient documentation

## 2023-09-03 DIAGNOSIS — Z91010 Allergy to peanuts: Secondary | ICD-10-CM | POA: Insufficient documentation

## 2023-09-03 LAB — URINALYSIS, ROUTINE W REFLEX MICROSCOPIC
Glucose, UA: NEGATIVE mg/dL
Ketones, ur: 15 mg/dL — AB
Nitrite: NEGATIVE
Protein, ur: 100 mg/dL — AB
Specific Gravity, Urine: >=1.03 (ref 1.005–1.030)
pH: 6 (ref 5.0–8.0)

## 2023-09-03 LAB — CBC
HCT: 40 % (ref 36.0–46.0)
Hemoglobin: 13.9 g/dL (ref 12.0–15.0)
MCH: 31 pg (ref 26.0–34.0)
MCHC: 34.8 g/dL (ref 30.0–36.0)
MCV: 89.1 fL (ref 80.0–100.0)
Platelets: 276 10*3/uL (ref 150–400)
RBC: 4.49 MIL/uL (ref 3.87–5.11)
RDW: 12.6 % (ref 11.5–15.5)
WBC: 5.8 10*3/uL (ref 4.0–10.5)
nRBC: 0 % (ref 0.0–0.2)

## 2023-09-03 LAB — DIFFERENTIAL
Abs Immature Granulocytes: 0.02 10*3/uL (ref 0.00–0.07)
Basophils Absolute: 0.1 10*3/uL (ref 0.0–0.1)
Basophils Relative: 1 %
Eosinophils Absolute: 0.1 10*3/uL (ref 0.0–0.5)
Eosinophils Relative: 1 %
Immature Granulocytes: 0 %
Lymphocytes Relative: 42 %
Lymphs Abs: 2.4 10*3/uL (ref 0.7–4.0)
Monocytes Absolute: 0.5 10*3/uL (ref 0.1–1.0)
Monocytes Relative: 8 %
Neutro Abs: 2.7 10*3/uL (ref 1.7–7.7)
Neutrophils Relative %: 48 %

## 2023-09-03 LAB — PROTIME-INR
INR: 0.9 (ref 0.8–1.2)
Prothrombin Time: 12.7 s (ref 11.4–15.2)

## 2023-09-03 LAB — RAPID URINE DRUG SCREEN, HOSP PERFORMED
Amphetamines: NOT DETECTED
Barbiturates: NOT DETECTED
Benzodiazepines: NOT DETECTED
Cocaine: NOT DETECTED
Opiates: NOT DETECTED
Tetrahydrocannabinol: NOT DETECTED

## 2023-09-03 LAB — COMPREHENSIVE METABOLIC PANEL
ALT: 54 U/L — ABNORMAL HIGH (ref 0–44)
AST: 40 U/L (ref 15–41)
Albumin: 3.8 g/dL (ref 3.5–5.0)
Alkaline Phosphatase: 49 U/L (ref 38–126)
Anion gap: 6 (ref 5–15)
BUN: 14 mg/dL (ref 6–20)
CO2: 24 mmol/L (ref 22–32)
Calcium: 8.7 mg/dL — ABNORMAL LOW (ref 8.9–10.3)
Chloride: 106 mmol/L (ref 98–111)
Creatinine, Ser: 1.13 mg/dL — ABNORMAL HIGH (ref 0.44–1.00)
GFR, Estimated: >60 mL/min (ref >60)
Glucose, Bld: 100 mg/dL — ABNORMAL HIGH (ref 70–99)
Potassium: 3.5 mmol/L (ref 3.5–5.1)
Sodium: 136 mmol/L (ref 135–145)
Total Bilirubin: 0.8 mg/dL (ref 0.0–1.2)
Total Protein: 7.5 g/dL (ref 6.5–8.1)

## 2023-09-03 LAB — ETHANOL: Alcohol, Ethyl (B): <10 mg/dL (ref <10)

## 2023-09-03 LAB — CBG MONITORING, ED: Glucose-Capillary: 98 mg/dL (ref 70–99)

## 2023-09-03 LAB — URINALYSIS, MICROSCOPIC (REFLEX)

## 2023-09-03 LAB — APTT: aPTT: 23 s — ABNORMAL LOW (ref 24–36)

## 2023-09-03 LAB — PREGNANCY, URINE: Preg Test, Ur: NEGATIVE

## 2023-09-03 MED ORDER — ACETAMINOPHEN 325 MG PO TABS
650.0000 mg | ORAL_TABLET | Freq: Once | ORAL | Status: AC
  Administered 2023-09-03: 650 mg via ORAL
  Filled 2023-09-03: qty 2

## 2023-09-03 MED ORDER — IOHEXOL 350 MG/ML SOLN
75.0000 mL | Freq: Once | INTRAVENOUS | Status: AC | PRN
  Administered 2023-09-03: 75 mL via INTRAVENOUS

## 2023-09-03 NOTE — ED Triage Notes (Signed)
 Pt arrives as transfer for MRI recommended by neuro. [facility] 3. Pt A[facility]4. LKW 3/5.

## 2023-09-03 NOTE — ED Notes (Signed)
 Report called and given to [place], Consulting civil engineer at [facility]

## 2023-09-03 NOTE — Plan of Care (Signed)
 On-call neurologist note  Patient coming in with 3 days of left-sided weakness.  Prior history of seizures not on medication with no seizures in many years. Concern for somewhat functional symptoms per the EDP.  Given young age, recommend obtaining MRI brain with and without contrast to rule out stroke versus demyelinating disease.  If MRI is negative, no further workup.  If MRI shows any positive findings, please call inpatient neurology at [facility] for formal consultation  Signed electronically Milon Dikes MD

## 2023-09-03 NOTE — ED Provider Notes (Signed)
 [place] [facility] Provider Note   CSN: [id number] Arrival date & time: 09/03/23  1528     History  Chief Complaint  Patient presents with   Facial Droop   Weakness    Lisa Holder is a 43 y.o. female.  The history is provided by the patient and medical records. No language interpreter was used.  Weakness Severity:  Severe Onset quality:  Sudden Duration:  3 days Timing:  Constant Progression:  Unchanged Chronicity:  New Relieved by:  Nothing Ineffective treatments:  None tried Associated symptoms: aphasia (per pt had difficulty speaking at onset), dizziness (resovled), extremity numbness, seizures (hx of er pt), sensory-motor deficit and stroke symptoms   Associated symptoms: no abdominal pain, no chest pain, no cough, no diarrhea, no drooling, no dysphagia, no falls, no fever, no frequency, no headaches, no loss of consciousness, no myalgias, no nausea, no near-syncope, no shortness of breath, no vision change and no vomiting        Home Medications Prior to Admission medications   Medication Sig Start Date End Date Taking? Authorizing Provider  albuterol (PROVENTIL) (5 MG/ML) 0.5% nebulizer solution Take 0.5 mLs (2.5 mg total) by nebulization every 6 (six) hours as needed for wheezing or shortness of breath. 06/14/21   Tilden Fossa, MD  albuterol (VENTOLIN HFA) 108 (90 Base) MCG/ACT inhaler Inhale 1-2 puffs into the lungs every 6 (six) hours as needed for wheezing or shortness of breath. 08/18/21   Sponseller, Lupe Carney R, PA-C  methylPREDNISolone (MEDROL DOSEPAK) 4 MG TBPK tablet Follow instructions on the package. 02/03/23   Scarlette Ar, MD      Allergies    Peanut-containing drug products    Review of Systems   Review of Systems  Constitutional:  Negative for chills, fatigue and fever.  HENT:  Negative for congestion and drooling.   Respiratory:  Negative for cough, chest tightness, shortness of breath and wheezing.    Cardiovascular:  Negative for chest pain, palpitations and near-syncope.  Gastrointestinal:  Negative for abdominal pain, diarrhea, dysphagia, nausea and vomiting.  Genitourinary:  Negative for frequency.  Musculoskeletal:  Negative for back pain, falls and myalgias.  Skin:  Negative for rash.  Neurological:  Positive for dizziness (resovled), seizures (hx of er pt), facial asymmetry, speech difficulty (resolved), weakness and numbness. Negative for loss of consciousness, light-headedness and headaches.  Psychiatric/Behavioral:  Negative for agitation and confusion.     Physical Exam Updated Vital Signs BP 125/85   Pulse 75   Temp 98.1 F (36.7 C) (Oral)   Resp (!) 27   Ht 5\' 5"  (1.651 m)   Wt 90.7 kg   SpO2 100%   BMI 33.27 kg/m  Physical Exam Vitals and nursing note reviewed.  Constitutional:      General: She is not in acute distress.    Appearance: She is well-developed. She is not ill-appearing, toxic-appearing or diaphoretic.  HENT:     Head: Atraumatic.     Nose: No congestion or rhinorrhea.     Mouth/Throat:     Pharynx: No oropharyngeal exudate or posterior oropharyngeal erythema.  Eyes:     Extraocular Movements: Extraocular movements intact.     Conjunctiva/sclera: Conjunctivae normal.     Pupils: Pupils are equal, round, and reactive to light.  Neck:     Vascular: No carotid bruit.  Cardiovascular:     Rate and Rhythm: Normal rate and regular rhythm.     Heart sounds: No murmur heard. Pulmonary:  Effort: Pulmonary effort is normal. No respiratory distress.     Breath sounds: Normal breath sounds. No wheezing, rhonchi or rales.  Chest:     Chest wall: No tenderness.  Abdominal:     General: Abdomen is flat.     Palpations: Abdomen is soft.     Tenderness: There is no abdominal tenderness. There is no right CVA tenderness, left CVA tenderness, guarding or rebound.  Musculoskeletal:        General: No swelling or tenderness.     Cervical back: Neck  supple. No tenderness.     Right lower leg: No edema.     Left lower leg: No edema.  Skin:    General: Skin is warm and dry.     Capillary Refill: Capillary refill takes less than 2 seconds.     Findings: No erythema or rash.  Neurological:     Mental Status: She is alert.     Sensory: Sensory deficit present.     Motor: Weakness present.  Psychiatric:        Mood and Affect: Mood normal.     ED Results / Procedures / Treatments   Labs (all labs ordered are listed, but only abnormal results are displayed) Labs Reviewed  APTT - Abnormal; Notable for the following components:      Result Value   aPTT 23 (*)    All other components within normal limits  COMPREHENSIVE METABOLIC PANEL - Abnormal; Notable for the following components:   Glucose, Bld 100 (*)    Creatinine, Ser 1.13 (*)    Calcium 8.7 (*)    ALT 54 (*)    All other components within normal limits  URINALYSIS, ROUTINE W REFLEX MICROSCOPIC - Abnormal; Notable for the following components:   APPearance CLOUDY (*)    Hgb urine dipstick LARGE (*)    Bilirubin Urine SMALL (*)    Ketones, ur 15 (*)    Protein, ur 100 (*)    Leukocytes,Ua SMALL (*)    All other components within normal limits  URINALYSIS, MICROSCOPIC (REFLEX) - Abnormal; Notable for the following components:   Bacteria, UA MANY (*)    Non Squamous Epithelial PRESENT (*)    All other components within normal limits  ETHANOL  PROTIME-INR  CBC  DIFFERENTIAL  RAPID URINE DRUG SCREEN, HOSP PERFORMED  PREGNANCY, URINE  CBG MONITORING, ED    EKG EKG Interpretation Date/Time:  Friday September 03 2023 15:37:07 EST Ventricular Rate:  76 PR Interval:  137 QRS Duration:  89 QT Interval:  382 QTC Calculation: 430 R Axis:   67  Text Interpretation: Sinus rhythm no prior ECG for comparison No STEMI Confirmed by Theda Belfast ([id number]) on 09/03/2023 4:04:06 PM  Radiology CT ANGIO HEAD NECK W WO CM Result Date: 09/03/2023 CLINICAL DATA:  Neuro deficit,  concern for stroke. Three day history of left facial droop and numbness, left arm/leg numbness and weakness. EXAM: CT ANGIOGRAPHY HEAD AND NECK WITH AND WITHOUT CONTRAST TECHNIQUE: Multidetector CT imaging of the head and neck was performed using the standard protocol during bolus administration of intravenous contrast. Multiplanar CT image reconstructions and MIPs were obtained to evaluate the vascular anatomy. Carotid stenosis measurements (when applicable) are obtained utilizing NASCET criteria, using the distal internal carotid diameter as the denominator. RADIATION DOSE REDUCTION: This exam was performed according to the departmental dose-optimization program which includes automated exposure control, adjustment of the mA and/or kV according to patient size and/or use of iterative reconstruction technique. CONTRAST:  75mL OMNIPAQUE IOHEXOL 350 MG/ML SOLN COMPARISON:  None Available. FINDINGS: CT HEAD FINDINGS Brain: No acute intracranial hemorrhage. No CT evidence of acute infarct. No edema, mass effect, or midline shift. The basilar cisterns are patent. Ventricles: Ventricles are normal in size and configuration. Vascular: No hyperdense vessel. Skull: No acute or aggressive finding. Sinuses/orbits: Orbits are symmetric. Mucosal thickening and secretions in the right sphenoid sinus. Additional mucosal thickening and possible air-fluid level in the left sphenoid sinus. Other: Mastoid air cells are clear. CTA NECK FINDINGS Aortic arch: Four vessel configuration of the aortic arch. Aberrant right subclavian artery noted. Imaged portion shows no evidence of aneurysm or dissection. No significant stenosis of the major arch vessel origins. Pulmonary arteries: As permitted by contrast timing, there are no filling defects in the visualized pulmonary arteries. Subclavian arteries: Aberrant right subclavian artery as above. Subclavian arteries are patent bilaterally. Right carotid system: No evidence of dissection,  stenosis (50% or greater), or occlusion. Left carotid system: No evidence of dissection, stenosis (50% or greater), or occlusion. Vertebral arteries: Codominant. No evidence of dissection, stenosis (50% or greater), or occlusion. Slightly limited evaluation of the right V1 segment due to adjacent dense venous contrast. Skeleton: No acute or aggressive finding noted. Other neck: The visualized airway is patent. No cervical lymphadenopathy. Upper chest: Visualized lung apices are clear. Review of the MIP images confirms the above findings CTA HEAD FINDINGS ANTERIOR CIRCULATION: The intracranial ICAs are patent bilaterally. No significant stenosis, proximal occlusion, aneurysm, or vascular malformation. MCAs: The middle cerebral arteries are patent bilaterally. ACAs: The anterior cerebral arteries are patent bilaterally. POSTERIOR CIRCULATION: No significant stenosis, proximal occlusion, aneurysm, or vascular malformation. PCAs: The posterior cerebral arteries are patent bilaterally. Pcomm: The posterior communicating arteries are visualized bilaterally. SCAs: The superior cerebellar arteries are patent bilaterally. Basilar artery: Patent AICAs: Visualized bilaterally. PICAs: Visualized bilaterally. Vertebral arteries: The intracranial vertebral arteries are patent. Venous sinuses: As permitted by contrast timing, patent. Anatomic variants: None Review of the MIP images confirms the above findings IMPRESSION: 1. No acute intracranial abnormality. 2. No intracranial large vessel occlusion or hemodynamically significant stenosis. 3. No significant stenosis of the carotid or vertebral arteries. 4. Aberrant right subclavian artery. 5. Mucosal thickening and secretions in the right sphenoid sinus. Additional mucosal thickening and possible air-fluid level in the left sphenoid sinus. Correlate for acute sinusitis. Electronically Signed   By: Emily Filbert M.D.   On: 09/03/2023 21:50    Procedures Procedures     Medications Ordered in ED Medications  iohexol (OMNIPAQUE) 350 MG/ML injection 75 mL (75 mLs Intravenous Contrast Given 09/03/23 1721)  acetaminophen (TYLENOL) tablet 650 mg (650 mg Oral Given 09/03/23 1853)    ED Course/ Medical Decision Making/ A&P                                 Medical Decision Making Amount and/or Complexity of Data Reviewed Labs: ordered. Radiology: ordered.  Risk OTC drugs. Prescription drug management.    Lisa Holder is a 43 y.o. female with a past medical history significant for asthma and previous seizures who presents for neurologic complaints.  According to patient, for the last 3 days she has had left-sided deficits and some waxing and waning other symptoms.  She reports that she was last normal 3 days ago and then at some point since then noticed some left-sided weakness and numbness in her left lower face, left arm, and left leg.  She reports she also had some dizziness that was spinning-like and difficulty speaking that has since resolved.  She reports the left arm and left leg still feel numb and weak compared to the right side.  She is right-handed.  She denies any history of this and initially thought it was related to seizures but has never had symptoms like this before.  She reports she is not on seizure medicine and has not had a seizure in over several years.  On exam, patient has left facial numbness, left arm, and left leg numbness compared to right.  She had a drift with her left leg and drift with her left arm compared to the right side respectively.  Her left face looks symmetric when she tried to smile and show all of her teeth but then at rest it appeared that her left lower face/lip were tugging downward.  This may have been slightly distractible.  Otherwise she had pupils that were symmetric and reactive with normal extraocular movements.  Speech was clear for me.  No carotid bruit on exam.  With some vibration testing patient noted  difference in her left arm compared to the right and left cheek compared to the right however when the tuning fork was placed in the center of the forehead and leaned over each eye, the patient could feel vibration sense when the tuning fork was leaning over the right eye but not over the left eye even sequentially going back and forth with the base staying centrally on the forehead.  Clinically I suspect there may be some functional component to her symptoms however as she is in her 45s and is having some neurological deficits, we will do full workup to look for concerning findings.  We do not have MRI at this facility so we will get CTA head and neck given the reported transient speech and dizzy components to to look for posterior circulations problems.  I spoke to Dr. Wilford Corner with neurology who felt that given her age she would benefit from MRI brain with and without contrast which will need to do ED transfer for later after the CTs are completed.  If MRI shows concerning findings, she would likely need admission and consultation to the neurology team there.  If it is all reassuring, patient may be stable for discharge.  She will also get stroke labs and further workup.  10:26 PM Workup continues to return.  Patient's CT scan did not show subacute stroke and showed possible sinusitis.  Otherwise she did not have urinary symptoms so doubt UTI at this time.  Will transfer ED to ED to get the MRI as recommended by neurology.  She reports the symptoms were waxing and waning.  Patient will be transferred to [facility] emergency department excepted by Dr. Lockie Mola.  Anticipate MRI and disposition based on these findings.  If it is reassuring, anticipate discharge home versus touching base with neurology.         Final Clinical Impression(s) / ED Diagnoses Final diagnoses:  Facial droop  Left sided numbness  Transient neurological symptoms    Clinical Impression: 1. Facial droop   2. Left  sided numbness   3. Transient neurological symptoms     Disposition: ED to ED transfer to get MRI as recommended by neurology.  Dissipate disposition based on these findings.  This note was prepared with assistance of Conservation officer, historic buildings. Occasional wrong-word or sound-a-like substitutions may have occurred due to the inherent limitations of voice recognition  software.      Luverta Korte, Canary Brim, MD 09/03/23 2227

## 2023-09-03 NOTE — ED Triage Notes (Signed)
 Left sided facial droop and left sided weakness x 3 days  Left grip weak and unable to stand on left leg  Normal speech noted, A&O x 4 H/o seizures  No stroke history  Does not take blood thinners    LKW Wednsday Night  Dr. Rush Landmark at bedside

## 2023-09-04 ENCOUNTER — Emergency Department (HOSPITAL_COMMUNITY)

## 2023-09-04 MED ORDER — GADOBUTROL 1 MMOL/ML IV SOLN
9.0000 mL | Freq: Once | INTRAVENOUS | Status: AC | PRN
Start: 2023-09-04 — End: 2023-09-04
  Administered 2023-09-04: 9 mL via INTRAVENOUS

## 2023-09-04 MED ORDER — LORAZEPAM 1 MG PO TABS
1.0000 mg | ORAL_TABLET | Freq: Once | ORAL | Status: AC
  Administered 2023-09-04: 1 mg via ORAL
  Filled 2023-09-04: qty 1

## 2023-09-04 NOTE — Discharge Instructions (Signed)
Your caregiver has seen you today because you are having problems with feelings of weakness, dizziness, and/or fatigue. Weakness has many different causes, some of which are common and others are very rare. Your caregiver has considered some of the most common causes of weakness and feels it is safe for you to go home and be observed. Not every illness or injury can be identified during an emergency department visit, thus follow-up with your primary healthcare provider is important. Medical conditions can also worsen, so it is also important to return immediately as directed below, or if you have other serious concerns develop. ° °RETURN IMMEDIATELY IF  °you develop new shortness of breath, chest pain, fever, have difficulty moving parts of your body (new weakness, numbness, or incoordination), sudden change in speech, vision, swallowing, or understanding, faint or develop new dizziness, severe headache, become poorly responsive or have an altered mental status compared to baseline for you, new rash, abdominal pain, or bloody stools,  °Return sooner also if you develop new problems for which you have not talked to your caregiver but you feel may be emergency medical conditions. ° °

## 2023-09-04 NOTE — ED Provider Notes (Signed)
 MRI results are negative Patient reports feeling improved, she has been ambulatory.  Reports some the symptoms been ongoing for months No arm or leg drift. Plan for discharge and referral to neurology   Zadie Rhine, MD 09/04/23 [phone]

## 2023-09-04 NOTE — ED Provider Notes (Signed)
 Patient awaiting MRI, no acute distress, no new complaints, watching TV.   Zadie Rhine, MD 09/04/23 [phone]

## 2023-09-07 ENCOUNTER — Other Ambulatory Visit: Payer: Self-pay | Admitting: Internal Medicine

## 2023-09-07 DIAGNOSIS — Z1231 Encounter for screening mammogram for malignant neoplasm of breast: Secondary | ICD-10-CM

## 2023-09-08 ENCOUNTER — Ambulatory Visit (INDEPENDENT_AMBULATORY_CARE_PROVIDER_SITE_OTHER): Admitting: Neurology

## 2023-09-08 ENCOUNTER — Encounter: Payer: Self-pay | Admitting: Neurology

## 2023-09-08 VITALS — BP 123/82 | HR 72 | Ht 65.0 in | Wt 192.0 lb

## 2023-09-08 DIAGNOSIS — R200 Anesthesia of skin: Secondary | ICD-10-CM | POA: Diagnosis not present

## 2023-09-08 NOTE — Patient Instructions (Signed)
Continue to follow up with PCP  Return as needed  

## 2023-09-08 NOTE — Progress Notes (Signed)
 [facility]  PATIENT: Lisa Holder DOB: February 28, 1981  REQUESTING CLINICIAN: Zadie Rhine, MD HISTORY FROM: Patient  REASON FOR VISIT: Left face, arm and leg weakness    HISTORICAL  CHIEF COMPLAINT:  Chief Complaint  Patient presents with   Room 13    Pt is here Alone. Pt states that Wednesday of last week her numbness in the left side of her face started. Pt states that her lip started twitching and her lip drooped. Pt states that she had numbness in her arms and legs. Pt states that she had pain in her chest,she had a bad headache, and pins and needles feeling in her left arm.     HISTORY OF PRESENT ILLNESS:  This is a 43 year old woman past medical history of insomnia, history of back surgery who is presenting with left face, arm and leg weakness.  Patient reports the symptoms started last Wednesday, abruptly, got worse over the next 3 days and on Friday she presented to the ED.  In the ED, she did have a CT angiogram head and neck which was unrevealing, and MRI brain which showed no acute abnormality.  Patient was discharged home with neuro follow-up.  She tells me that her symptoms continue to improve, her weakness subsided but she does have some numbness and tingling.  Reported since her back surgery she has intermittent numbness in the left leg which is chronic and and now she is having bilateral arms and hand numbness which is also chronic not new. She denies any increase stress, denies any new stressor, denies any fall denies any triggering factor for her symptoms.  Her only complaint is insomnia.   OTHER MEDICAL CONDITIONS: Insomnia, History of back surgery    REVIEW OF SYSTEMS: Full 14 system review of systems performed and negative with exception of: As noted in the HPI   ALLERGIES: Allergies  Allergen Reactions   Adderall [Amphetamine-Dextroamphetamine]     Memory Loss Stuttering   Peanut-Containing Drug Products     HOME  MEDICATIONS: Outpatient Medications Prior to Visit  Medication Sig Dispense Refill   albuterol (PROVENTIL) (5 MG/ML) 0.5% nebulizer solution Take 0.5 mLs (2.5 mg total) by nebulization every 6 (six) hours as needed for wheezing or shortness of breath. 20 mL 0   albuterol (VENTOLIN HFA) 108 (90 Base) MCG/ACT inhaler Inhale 1-2 puffs into the lungs every 6 (six) hours as needed for wheezing or shortness of breath. 1 each 0   methylPREDNISolone (MEDROL DOSEPAK) 4 MG TBPK tablet Follow instructions on the package. 21 tablet 0   No facility-administered medications prior to visit.    PAST MEDICAL HISTORY: Past Medical History:  Diagnosis Date   Asthma    Seizures (HCC)    pt reports no seizures for 3 years    PAST SURGICAL HISTORY: Past Surgical History:  Procedure Laterality Date   BACK SURGERY     CESAREAN SECTION     SPINAL FUSION     TONSILLECTOMY Bilateral 02/03/2023   Procedure: TONSILLECTOMY;  Surgeon: Scarlette Ar, MD;  Location: [facility];  Service: ENT;  Laterality: Bilateral;    FAMILY HISTORY: Family History  Problem Relation Age of Onset   Diabetes Mother    Hypertension Mother    Diabetes Father    Hypertension Father     SOCIAL HISTORY: Social History   Socioeconomic History   Marital status: Married    Spouse name: Not on file   Number of children: Not on file   Years  of education: Not on file   Highest education level: Not on file  Occupational History   Not on file  Tobacco Use   Smoking status: Never   Smokeless tobacco: Never  Vaping Use   Vaping status: Never Used  Substance and Sexual Activity   Alcohol use: Not Currently   Drug use: Never   Sexual activity: Not on file  Other Topics Concern   Not on file  Social History Narrative   Not on file   Social Drivers of Health   Financial Resource Strain: Not on file  Food Insecurity: Not on file  Transportation Needs: Not on file  Physical Activity: Not on file  Stress:  Not on file  Social Connections: Not on file  Intimate Partner Violence: Not on file    PHYSICAL EXAM  GENERAL EXAM/CONSTITUTIONAL: Vitals:  Vitals:   09/08/23 0835  BP: 123/82  Pulse: 72  Weight: 192 lb (87.1 kg)  Height: 5\' 5"  (1.651 m)   Body mass index is 31.95 kg/m. Wt Readings from Last 3 Encounters:  09/08/23 192 lb (87.1 kg)  09/03/23 199 lb 15.3 oz (90.7 kg)  02/03/23 199 lb 15.3 oz (90.7 kg)   Patient is in no distress; well developed, nourished and groomed; neck is supple  MUSCULOSKELETAL: Gait, strength, tone, movements noted in Neurologic exam below  NEUROLOGIC: MENTAL STATUS:      No data to display         awake, alert, oriented to person, place and time recent and remote memory intact normal attention and concentration language fluent, comprehension intact, naming intact fund of knowledge appropriate  CRANIAL NERVE:  2nd, 3rd, 4th, 6th - Visual fields full to confrontation, extraocular muscles intact, no nystagmus 5th - facial sensation symmetric 7th - facial strength symmetric. Volitious right face weakness (even though she complaints of left face weakness) but symmetric on activation.  8th - hearing intact 9th - palate elevates symmetrically, uvula midline 11th - shoulder shrug symmetric 12th - tongue protrusion midline  MOTOR:  normal bulk and tone, full strength in the BUE, BLE  SENSORY:  normal and symmetric to light touch  COORDINATION:  finger-nose-finger, fine finger movements normal  REFLEXES:  deep tendon reflexes present and symmetric  GAIT/STATION:  normal     DIAGNOSTIC DATA (LABS, IMAGING, TESTING) - I reviewed patient records, labs, notes, testing and imaging myself where available.  Lab Results  Component Value Date   WBC 5.8 09/03/2023   HGB 13.9 09/03/2023   HCT 40.0 09/03/2023   MCV 89.1 09/03/2023   PLT 276 09/03/2023      Component Value Date/Time   NA 136 09/03/2023 1543   K 3.5 09/03/2023 1543    CL 106 09/03/2023 1543   CO2 24 09/03/2023 1543   GLUCOSE 100 (H) 09/03/2023 1543   BUN 14 09/03/2023 1543   CREATININE 1.13 (H) 09/03/2023 1543   CALCIUM 8.7 (L) 09/03/2023 1543   PROT 7.5 09/03/2023 1543   ALBUMIN 3.8 09/03/2023 1543   AST 40 09/03/2023 1543   ALT 54 (H) 09/03/2023 1543   ALKPHOS 49 09/03/2023 1543   BILITOT 0.8 09/03/2023 1543   GFRNONAA >60 09/03/2023 1543   No results found for: "CHOL", "HDL", "LDLCALC", "LDLDIRECT", "TRIG", "CHOLHDL" No results found for: "HGBA1C" No results found for: "VITAMINB12" No results found for: "TSH"  MRI Brain with and without contrast 09/04/2023 No evidence of acute intracranial abnormality   CTA head and neck 09/03/2023 1. No acute intracranial abnormality. 2. No  intracranial large vessel occlusion or hemodynamically significant stenosis. 3. No significant stenosis of the carotid or vertebral arteries.   ASSESSMENT AND PLAN  43 y.o. year old female with history of insomnia, lumbar surgery who is presenting with left face, arm and leg weakness which is now improved.  There is also intermittent numbness.  On exam patient has volitious right facial weakness even though she complains of left facial weakness but symmetric on activation.  She again denies any new stressors, denies any worsening stress.  At this time, workup including MRI and CT angiogram head and neck are unrevealing.  No further workup indicated.  Patient will continue to follow with PCP and return as needed.   1. Numbness     Patient Instructions  Continue to follow up with PCP  Return as needed   No orders of the defined types were placed in this encounter.   No orders of the defined types were placed in this encounter.   Return if symptoms worsen or fail to improve.    Windell Norfolk, MD 09/08/2023, 9:25 AM  [facility] [address], [address] [place], [place] [address] [phone]

## 2023-10-14 ENCOUNTER — Ambulatory Visit
